# Patient Record
Sex: Female | Born: 1990 | Race: Black or African American | Hispanic: No | Marital: Single | Smoking: Never smoker
Health system: Southern US, Community
[De-identification: ages and names within clinical notes are randomized; demographics above are authoritative.]

## PROBLEM LIST (undated history)

## (undated) DIAGNOSIS — N201 Calculus of ureter: Secondary | ICD-10-CM

## (undated) DIAGNOSIS — T7840XA Allergy, unspecified, initial encounter: Secondary | ICD-10-CM

## (undated) HISTORY — DX: Allergy, unspecified, initial encounter: T78.40XA

---
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
## 2010-06-17 ENCOUNTER — Ambulatory Visit: Payer: Self-pay | Admitting: Family Medicine

## 2010-10-21 ENCOUNTER — Telehealth: Payer: Self-pay | Admitting: Family Medicine

## 2010-10-21 NOTE — Telephone Encounter (Signed)
Called dad told him of what jcl had said

## 2010-10-21 NOTE — Telephone Encounter (Signed)
She needs a followup appointment. She has not been seen since May of last year

## 2010-10-25 ENCOUNTER — Encounter: Payer: Self-pay | Admitting: Family Medicine

## 2010-10-27 ENCOUNTER — Encounter: Payer: Self-pay | Admitting: Family Medicine

## 2010-10-27 ENCOUNTER — Ambulatory Visit (INDEPENDENT_AMBULATORY_CARE_PROVIDER_SITE_OTHER): Payer: Managed Care, Other (non HMO) | Admitting: Family Medicine

## 2010-10-27 VITALS — BP 120/80 | HR 90 | Wt 161.0 lb

## 2010-10-27 DIAGNOSIS — F909 Attention-deficit hyperactivity disorder, unspecified type: Secondary | ICD-10-CM | POA: Insufficient documentation

## 2010-10-27 NOTE — Progress Notes (Signed)
  Subjective:    Patient ID: Melanie Mclaughlin, female    DOB: 05/25/1990, 20 y.o.   MRN: [id number]  HPI She is here for a followup on her underlying ADD. She  Was diagnosed approximately 6 years ago. Presently she is on Adderall XR 20 mg. She states it lasts approximately 8 hours. She notes that it mainly helps her stay focused. She will occasionally have difficulty with headache with this. She is not sure which type of Adderall works best for her   Review of Systems     Objective:   Physical Exam Alert and in no distress otherwise not examined       Assessment & Plan:  ADD She will call and let me know which medicine works the best for her. I also talked to her about school in general and studying while the medicine is still taking affect. She has been doing this.

## 2010-10-27 NOTE — Patient Instructions (Signed)
Call back with the exact name of the medicine and I will write prescriptions for you

## 2010-11-15 ENCOUNTER — Telehealth: Payer: Self-pay

## 2010-11-15 NOTE — Telephone Encounter (Signed)
She needs an appointment.

## 2010-11-15 NOTE — Telephone Encounter (Signed)
Dad called said she needed refill of adderall will pick up when ready and she likes the pill not the capsule

## 2010-11-16 ENCOUNTER — Telehealth: Payer: Self-pay

## 2010-11-16 NOTE — Telephone Encounter (Signed)
Called pt to let her know Dr.lalonde wants her to have an appt. Left message

## 2010-11-16 NOTE — Telephone Encounter (Signed)
Left pt a message make appt

## 2010-11-17 ENCOUNTER — Telehealth: Payer: Self-pay | Admitting: Family Medicine

## 2010-11-17 NOTE — Telephone Encounter (Signed)
It appears that at visit there was a question of which medication seemed more effective--pt was to contact Dr. Susann Givens and let him know which formulation of Adderall she prefers (the XR or the regular).  Let us know, and we will Rx

## 2010-11-18 ENCOUNTER — Telehealth: Payer: Self-pay | Admitting: *Deleted

## 2010-11-18 DIAGNOSIS — F988 Other specified behavioral and emotional disorders with onset usually occurring in childhood and adolescence: Secondary | ICD-10-CM

## 2010-11-18 MED ORDER — AMPHETAMINE-DEXTROAMPHETAMINE 20 MG PO TABS: 20.0000 mg | ORAL_TABLET | Freq: Two times a day (BID) | ORAL | Status: DC

## 2010-11-18 NOTE — Telephone Encounter (Signed)
Left  Message for patient to call back and let Dr.Knapp know whether the patient wants the regular Adderall or the XR, also want to make sure she on on 20mg  BID.

## 2010-11-18 NOTE — Telephone Encounter (Signed)
Patient's father called back and let me know that Melanie Mclaughlin prefers the regular Adderall, not the XR and she takes 20mg  BID. Printed rx, Dr.Knapp will sign and patient may pick up today.

## 2011-01-14 LAB — DIFFERENTIAL
Basophils Absolute: 0.1
Basophils Relative: 1
Eosinophils Absolute: 0
Eosinophils Relative: 0
Lymphocytes Relative: 4 — ABNORMAL LOW
Lymphs Abs: 0.3 — ABNORMAL LOW
Monocytes Absolute: 0.3
Monocytes Relative: 4
Neutro Abs: 7.7
Neutrophils Relative %: 91 — ABNORMAL HIGH

## 2011-01-14 LAB — COMPREHENSIVE METABOLIC PANEL
ALT: 11
AST: 33
Albumin: 4.9
Calcium: 9.8
Creatinine, Ser: 0.7
Sodium: 139
Total Protein: 8.3

## 2011-01-14 LAB — CBC
HCT: 39.3
Hemoglobin: 13.7
MCHC: 34.8
MCV: 91.2
Platelets: 218
RBC: 4.3
RDW: 11.8
WBC: 8.4

## 2011-01-14 LAB — URINALYSIS, ROUTINE W REFLEX MICROSCOPIC
Bilirubin Urine: NEGATIVE
Glucose, UA: NEGATIVE
Hgb urine dipstick: NEGATIVE
Ketones, ur: 15 — AB
Nitrite: NEGATIVE
Protein, ur: NEGATIVE
Specific Gravity, Urine: 1.028
Urobilinogen, UA: 0.2
pH: 7.5

## 2011-01-14 LAB — COMPREHENSIVE METABOLIC PANEL WITH GFR
Alkaline Phosphatase: 55
BUN: 13
CO2: 25
Chloride: 102
Glucose, Bld: 98
Potassium: 4
Total Bilirubin: 0.6

## 2011-01-14 LAB — LIPASE, BLOOD: Lipase: 30

## 2011-01-14 LAB — PREGNANCY, URINE: Preg Test, Ur: NEGATIVE

## 2013-08-28 ENCOUNTER — Encounter (HOSPITAL_BASED_OUTPATIENT_CLINIC_OR_DEPARTMENT_OTHER): Payer: Self-pay | Admitting: Emergency Medicine

## 2013-08-28 ENCOUNTER — Emergency Department (HOSPITAL_BASED_OUTPATIENT_CLINIC_OR_DEPARTMENT_OTHER)
Admission: EM | Admit: 2013-08-28 | Discharge: 2013-08-28 | Disposition: A | Discharge status: Home / Self Care | Payer: Self-pay | Attending: Emergency Medicine | Admitting: Emergency Medicine

## 2013-08-28 ENCOUNTER — Emergency Department (HOSPITAL_BASED_OUTPATIENT_CLINIC_OR_DEPARTMENT_OTHER): Payer: Managed Care, Other (non HMO)

## 2013-08-28 ENCOUNTER — Ambulatory Visit: Payer: Self-pay | Admitting: Family Medicine

## 2013-08-28 DIAGNOSIS — Z79899 Other long term (current) drug therapy: Secondary | ICD-10-CM | POA: Insufficient documentation

## 2013-08-28 DIAGNOSIS — Z3202 Encounter for pregnancy test, result negative: Secondary | ICD-10-CM | POA: Insufficient documentation

## 2013-08-28 DIAGNOSIS — N200 Calculus of kidney: Secondary | ICD-10-CM | POA: Insufficient documentation

## 2013-08-28 DIAGNOSIS — R638 Other symptoms and signs concerning food and fluid intake: Secondary | ICD-10-CM | POA: Insufficient documentation

## 2013-08-28 LAB — COMPREHENSIVE METABOLIC PANEL
ALT: 13 U/L (ref 0–35)
AST: 24 U/L (ref 0–37)
Albumin: 4.3 g/dL (ref 3.5–5.2)
Alkaline Phosphatase: 49 U/L (ref 39–117)
BUN: 8 mg/dL (ref 6–23)
CALCIUM: 9.7 mg/dL (ref 8.4–10.5)
CO2: 22 meq/L (ref 19–32)
CREATININE: 1 mg/dL (ref 0.50–1.10)
Chloride: 103 mEq/L (ref 96–112)
GFR, EST NON AFRICAN AMERICAN: 79 mL/min — AB (ref >90)
GLUCOSE: 117 mg/dL — AB (ref 70–99)
Potassium: 3.7 mEq/L (ref 3.7–5.3)
Sodium: 141 mEq/L (ref 137–147)
Total Bilirubin: 0.2 mg/dL — ABNORMAL LOW (ref 0.3–1.2)
Total Protein: 7.8 g/dL (ref 6.0–8.3)

## 2013-08-28 LAB — CBC
HEMATOCRIT: 37.2 % (ref 36.0–46.0)
HEMOGLOBIN: 13.1 g/dL (ref 12.0–15.0)
MCH: 31.3 pg (ref 26.0–34.0)
MCHC: 35.2 g/dL (ref 30.0–36.0)
MCV: 89 fL (ref 78.0–100.0)
Platelets: 327 10*3/uL (ref 150–400)
RBC: 4.18 MIL/uL (ref 3.87–5.11)
RDW: 11.6 % (ref 11.5–15.5)
WBC: 10.8 10*3/uL — ABNORMAL HIGH (ref 4.0–10.5)

## 2013-08-28 LAB — URINALYSIS, ROUTINE W REFLEX MICROSCOPIC
BILIRUBIN URINE: NEGATIVE
Glucose, UA: NEGATIVE mg/dL
Ketones, ur: 15 mg/dL — AB
Leukocytes, UA: NEGATIVE
Nitrite: NEGATIVE
PH: 7 (ref 5.0–8.0)
Protein, ur: NEGATIVE mg/dL
SPECIFIC GRAVITY, URINE: 1.015 (ref 1.005–1.030)
UROBILINOGEN UA: 0.2 mg/dL (ref 0.0–1.0)

## 2013-08-28 LAB — URINE MICROSCOPIC-ADD ON

## 2013-08-28 LAB — PREGNANCY, URINE: Preg Test, Ur: NEGATIVE

## 2013-08-28 LAB — HIV ANTIBODY (ROUTINE TESTING W REFLEX): HIV 1&2 Ab, 4th Generation: NONREACTIVE

## 2013-08-28 MED ORDER — SODIUM CHLORIDE 0.9 % IV BOLUS (SEPSIS)
1000.0000 mL | Freq: Once | INTRAVENOUS | Status: AC
Start: 2013-08-28 — End: 2013-08-28
  Administered 2013-08-28: 1000 mL via INTRAVENOUS

## 2013-08-28 MED ORDER — ONDANSETRON HCL 4 MG PO TABS: 4.0000 mg | ORAL_TABLET | Freq: Four times a day (QID) | ORAL | Status: DC

## 2013-08-28 MED ORDER — ONDANSETRON HCL 4 MG/2ML IJ SOLN
4.0000 mg | Freq: Once | INTRAMUSCULAR | Status: AC
  Administered 2013-08-28: 4 mg via INTRAVENOUS

## 2013-08-28 MED ORDER — HYDROMORPHONE HCL PF 1 MG/ML IJ SOLN
0.5000 mg | Freq: Once | INTRAMUSCULAR | Status: AC
  Administered 2013-08-28: 0.5 mg via INTRAVENOUS
  Filled 2013-08-28: qty 1

## 2013-08-28 MED ORDER — OXYCODONE-ACETAMINOPHEN 5-325 MG PO TABS: 1.0000 | ORAL_TABLET | ORAL | Status: DC | PRN

## 2013-08-28 MED ORDER — SODIUM CHLORIDE 0.9 % IV BOLUS (SEPSIS)
1000.0000 mL | Freq: Once | INTRAVENOUS | Status: AC
  Administered 2013-08-28: 1000 mL via INTRAVENOUS

## 2013-08-28 MED ORDER — ONDANSETRON HCL 4 MG/2ML IJ SOLN
INTRAMUSCULAR | Status: AC
  Administered 2013-08-28: 4 mg via INTRAVENOUS
  Filled 2013-08-28: qty 2

## 2013-08-28 NOTE — ED Notes (Signed)
Abdominal pain and vomiting that started today.

## 2013-08-28 NOTE — ED Notes (Signed)
In and out cath performed, pt tolerated.  Cloudy urine return noted.

## 2013-08-28 NOTE — ED Notes (Signed)
PA at bedside.

## 2013-08-28 NOTE — ED Notes (Signed)
Family at bedside. 

## 2013-08-28 NOTE — ED Notes (Signed)
Pt ambulated unassisted to bathroom to attempt to provide urine sample.

## 2013-08-28 NOTE — ED Notes (Signed)
Pt returned to room. Still unable to urinate.

## 2013-08-28 NOTE — ED Provider Notes (Signed)
CSN: [id number]     Arrival date & time 08/28/13  1240 History   First MD Initiated Contact with Patient 08/28/13 1303     Chief Complaint  Patient presents with  . Abdominal Pain     (Consider location/radiation/quality/duration/timing/severity/associated sxs/prior Treatment) HPI Comments: 23 year old presents with sudden onset, 5 hours, of sharp LLQ pain with no radiation.  She rates her pain an 8/10 that doubled her over.  Standing up makes the pain worse, and she only had a few moments of relief after she vomited.  She has had nausea and vomiting with no blood.  Denies having a fever or chills.  Has not had an appetite today.  Denies chest pain, SOB, constipation, diarrhea, melena, dysuria, hematuria, or vaginal discharge.  Her LMP was last week.   Patient is a 23 y.o. female presenting with abdominal pain.  Abdominal Pain Associated symptoms: nausea and vomiting   Associated symptoms: no chest pain, no constipation, no cough, no diarrhea, no fever, no hematuria, no shortness of breath, no vaginal bleeding and no vaginal discharge       Past Medical History  Diagnosis Date  . Allergy     RHINITIS   History reviewed. No pertinent past surgical history. Family History  Problem Relation Age of Onset  . Asthma Sister   . Cancer Maternal Aunt   . Hypertension Maternal Grandmother    History  Substance Use Topics  . Smoking status: Never Smoker   . Smokeless tobacco: Never Used  . Alcohol Use: No   OB History   Grav Para Term Preterm Abortions TAB SAB Ect Mult Living                 Review of Systems  Constitutional: Positive for appetite change. Negative for fever.  Respiratory: Negative for cough, chest tightness, shortness of breath and wheezing.   Cardiovascular: Negative for chest pain.  Gastrointestinal: Positive for nausea, vomiting, abdominal pain and abdominal distention. Negative for diarrhea, constipation and blood in stool.  Genitourinary: Negative for  hematuria, flank pain, vaginal bleeding, vaginal discharge and pelvic pain.  Neurological: Negative for numbness and headaches.      Allergies  Review of patient's allergies indicates no known allergies.  Home Medications   Prior to Admission medications   Medication Sig Start Date End Date Taking? Authorizing Provider  amphetamine-dextroamphetamine (ADDERALL) 20 MG tablet Take 1 tablet (20 mg total) by mouth 2 (two) times daily. 11/18/10   Joselyn ArrowEve Knapp, MD   Pulse 119  Temp(Src) 98.4 F (36.9 C) (Oral)  Resp 20  Ht 5\' 7"  (1.702 m)  Wt 160 lb (72.576 kg)  BMI 25.05 kg/m2  SpO2 100%  LMP 08/21/2013 Physical Exam  Vitals reviewed. Constitutional: She is oriented to person, place, and time. She appears well-developed and well-nourished.  Uncomfortable appearing.  HENT:  Head: Normocephalic.  Eyes: Pupils are equal, round, and reactive to light.  Neck: Normal range of motion.  Cardiovascular: Normal rate, regular rhythm and normal heart sounds.   Pulmonary/Chest: Effort normal and breath sounds normal. She has no wheezes.  Abdominal: Soft. She exhibits no mass. There is tenderness. There is no rebound.  Patients abdomen felt soft but distended.  Unable to lie still. Bowel sounds were hypoactive.  Genitourinary:  Deferred due to patient discomfort   Neurological: She is alert and oriented to person, place, and time.  Psychiatric: She has a normal mood and affect.    ED Course  Procedures (including critical care time)  Labs Review Labs Reviewed  WET PREP, GENITAL  GC/CHLAMYDIA PROBE AMP  PREGNANCY, URINE  URINALYSIS, ROUTINE W REFLEX MICROSCOPIC  CBC  COMPREHENSIVE METABOLIC PANEL  HIV ANTIBODY (ROUTINE TESTING)   Results for orders placed during the hospital encounter of 08/28/13  PREGNANCY, URINE      Result Value Ref Range   Preg Test, Ur NEGATIVE  NEGATIVE  URINALYSIS, ROUTINE W REFLEX MICROSCOPIC      Result Value Ref Range   Color, Urine YELLOW  YELLOW    APPearance CLOUDY (*) CLEAR   Specific Gravity, Urine 1.015  1.005 - 1.030   pH 7.0  5.0 - 8.0   Glucose, UA NEGATIVE  NEGATIVE mg/dL   Hgb urine dipstick LARGE (*) NEGATIVE   Bilirubin Urine NEGATIVE  NEGATIVE   Ketones, ur 15 (*) NEGATIVE mg/dL   Protein, ur NEGATIVE  NEGATIVE mg/dL   Urobilinogen, UA 0.2  0.0 - 1.0 mg/dL   Nitrite NEGATIVE  NEGATIVE   Leukocytes, UA NEGATIVE  NEGATIVE  CBC      Result Value Ref Range   WBC 10.8 (*) 4.0 - 10.5 K/uL   RBC 4.18  3.87 - 5.11 MIL/uL   Hemoglobin 13.1  12.0 - 15.0 g/dL   HCT 16.1  09.6 - 04.5 %   MCV 89.0  78.0 - 100.0 fL   MCH 31.3  26.0 - 34.0 pg   MCHC 35.2  30.0 - 36.0 g/dL   RDW 40.9  81.1 - 91.4 %   Platelets 327  150 - 400 K/uL  COMPREHENSIVE METABOLIC PANEL      Result Value Ref Range   Sodium 141  137 - 147 mEq/L   Potassium 3.7  3.7 - 5.3 mEq/L   Chloride 103  96 - 112 mEq/L   CO2 22  19 - 32 mEq/L   Glucose, Bld 117 (*) 70 - 99 mg/dL   BUN 8  6 - 23 mg/dL   Creatinine, Ser 7.82  0.50 - 1.10 mg/dL   Calcium 9.7  8.4 - 95.6 mg/dL   Total Protein 7.8  6.0 - 8.3 g/dL   Albumin 4.3  3.5 - 5.2 g/dL   AST 24  0 - 37 U/L   ALT 13  0 - 35 U/L   Alkaline Phosphatase 49  39 - 117 U/L   Total Bilirubin 0.2 (*) 0.3 - 1.2 mg/dL   GFR calc non Af Amer 79 (*) >90 mL/min   GFR calc Af Amer >90  >90 mL/min  URINE MICROSCOPIC-ADD ON      Result Value Ref Range   Squamous Epithelial / LPF FEW (*) RARE   RBC / HPF TOO NUMEROUS TO COUNT  <3 RBC/hpf   Bacteria, UA FEW (*) RARE   Ct Abdomen Pelvis Wo Contrast  08/28/2013   CLINICAL DATA:  Abdominal pain  EXAM: CT ABDOMEN AND PELVIS WITHOUT CONTRAST  TECHNIQUE: Multidetector CT imaging of the abdomen and pelvis was performed following the standard protocol without IV contrast.  COMPARISON:  None.  FINDINGS: There is no pleural or pericardial effusion. The lung bases appear clear.  7 mm low attenuation structure in the periphery of the right hepatic lobe is too small to characterize. The  gallbladder appears normal. No biliary dilatation. Normal appearance of the pancreas. The spleen is unremarkable.  The adrenal glands are both normal. Small bilateral renal calculi are identified this measures up to 3 mm, image 24/series 2 peer there is left-sided nephromegaly and perinephric fat stranding. Left-sided  hydroureter is identified stone in the distal left ureter measures measures 3 mm, image 62/series 5. The urinary bladder appears normal. The uterus and adnexal structures are unremarkable.  Normal caliber of the abdominal aorta. No aneurysm. No upper abdominal adenopathy identified. There is no pelvic or inguinal adenopathy noted.  The stomach and the small bowel loops have a normal course and caliber. No obstruction. The appendix is visualized and appears normal. Normal appearance of the colon.  Review of the visualized bony structures is unremarkable.  IMPRESSION: 1. Bilateral renal calculi. 2. Mild left-sided hydronephrosis and nephromegaly secondary to 3 mm distal ureteral calculus.   Electronically Signed   By: Signa Kellaylor  Stroud M.D.   On: 08/28/2013 17:17   Imaging Review No results found.   EKG Interpretation None      MDM   Final diagnoses:  None    1. Kidney stones  Pain is controlled in the ED with IV Dilaudid. No history of stones. Will provide urology referral as she has bilateral renal stones in addition to the 3 mm ureteral stone on left likely the source of her pain. Stable for discharge.     Arnoldo HookerShari A Alyana Kreiter, PA-C 08/28/13 1746

## 2013-08-28 NOTE — Discharge Instructions (Signed)
Diet for Kidney Stones °Kidney stones are small, hard masses that form inside your kidneys. They are made up of salts and minerals and often form when high levels build up in the urine. The minerals can then start to build up, crystalize, and stick together to form stones. There are several different types of kidney stones. The following types of stones may be influenced by dietary factors:  °· Calcium Oxalate Stones. An oxalate is a salt found in certain foods. Within the body, calcium can combine with oxalates to form calcium oxalate stones, which can be excreted in the urine in high amounts. This is the most common type of kidney stone. °· Calcium Phosphate Stones. These stones may occur when the pH of the urine becomes too high, or less acidic, from too much calcium being excreted in the urine. The pH is a measure of how acidic or basic a substance is. °· Uric Acid Stones. This type of stone occurs when the pH of the urine becomes too low, or very acidic, because substances called purines build up in the urine. Purines are found in animal proteins. When the urine is highly concentrated with acid, uric acid kidney stones can form.   °Other risk factors for kidney stones include genetics, environment, and being overweight. Your caregiver may ask you to follow specific diet guidelines based on the type of stone you have to lessen the chances of your body making more kidney stones.  °GENERAL GUIDELINES FOR ALL TYPES OF STONES °· Drink plenty of fluid. Drink 12 16 cups of fluid a day, drinking mainly water. This is the most important thing you can do to prevent the formation of future kidney stones. °· Maintain a healthy weight. Your caregiver or dietitian can help you determine what a healthy weight is for you. If you are overweight, weight loss may help prevent the formation of future kidney stones. °· Eat a diet adequate in animal protein. Too much animal protein can contribute to the formation of stones. Your  dietitian can help you determine how much protein you should be eating. Avoid low carbohydrate, high protein diets. °· Follow a balanced eating approach. The DASH diet, which stands for "Dietary Approaches to Stop Hypertension," is an effective meal plan for reducing stone formation. This diet is high in fruits, vegetables, dairy, and whole grains and low in animal protein. Ask your caregiver or dietitian for information about the DASH diet. °ADDITIONAL DIET GUIDELINES FOR CALCIUM STONES °Avoid foods high in salt. This includes table salt, salt seasonings, MSG, soy sauce, cured and processed meats, salted crackers and snack foods, fast food, and canned soups and foods. Ask your caregiver or dietitian for information about reducing sodium in your diet or following the low sodium diet.  °Ensure adequate calcium intake. Use the following table for calcium guidelines: °· Men 65 years old and younger  1000 mg/day. °· Men 65 years old and older  1500 mg/day. °· Women 25 23 years old  1000 mg/day. °· Women 50 years and older  1500 mg/day. °Your dietitian can help you determine if you are getting enough calcium in your diet. Foods that are high in calcium include dairy products, broccoli, cheese, yogurt, and pudding. If you need to take a calcium supplement, take it only in the form of calcium citrate.  °Avoid foods high in oxalate. Be sure that any supplements you take do not contain more than 500 mg of vitamin C. Vitamin C is converted into oxalate in the body. You do   not need to avoid fruits and vegetables high in vitamin C.  °· Grains: High-fiber or bran cereal, whole-wheat bread, grits, barley, buckwheat, amaranth, pretzels, and fruitcake. °· Vegetables: Dried beans, wax beans, dark leafy greens, eggplant, leeks, okra, parsley, rutabaga, tomato paste, watercress, zucchini, and escarole. °· Fruit: Dried apricots, red currants, figs, kiwi, and rhubarb. °· Meat and Meat Substitutes: Soybeans and foods made from soy  (soyburger, miso), dried beans, peanut butter. °· Milk: Chocolate milk mixes and soymilk. °· Fats and Oils: Nuts (peanuts, almonds, pecans, cashews, hazelnuts) and nut butters, sesame seeds, and tDahini paste. °· Condiments/Miscellaneous: Chocolate, carob, marmalade, poppy seeds, instant iced tea, and juice from high-oxalate fruits.    °Document Released: 07/30/2010 Document Revised: 10/04/2011 Document Reviewed: 09/19/2011 °Exit[employer]® Patient Information ©2014 [employer]. °Kidney Stones °Kidney stones (urolithiasis) are deposits that form inside your kidneys. The intense pain is caused by the stone moving through the urinary tract. When the stone moves, the ureter goes into spasm around the stone. The stone is usually passed in the urine.  °CAUSES  °· A disorder that makes certain neck glands produce too much parathyroid hormone (primary hyperparathyroidism). °· A buildup of uric acid crystals, similar to gout in your joints. °· Narrowing (stricture) of the ureter. °· A kidney obstruction present at birth (congenital obstruction). °· Previous surgery on the kidney or ureters. °· Numerous kidney infections. °SYMPTOMS  °· Feeling sick to your stomach (nauseous). °· Throwing up (vomiting). °· Blood in the urine (hematuria). °· Pain that usually spreads (radiates) to the groin. °· Frequency or urgency of urination. °DIAGNOSIS  °· Taking a history and physical exam. °· Blood or urine tests. °· CT scan. °· Occasionally, an examination of the inside of the urinary bladder (cystoscopy) is performed. °TREATMENT  °· Observation. °· Increasing your fluid intake. °· Extracorporeal shock wave lithotripsy This is a noninvasive procedure that uses shock waves to break up kidney stones. °· Surgery may be needed if you have severe pain or persistent obstruction. There are various surgical procedures. Most of the procedures are performed with the use of small instruments. Only small incisions are needed to accommodate these  instruments, so recovery time is minimized. °The size, location, and chemical composition are all important variables that will determine the proper choice of action for you. Talk to your health care provider to better understand your situation so that you will minimize the risk of injury to yourself and your kidney.  °HOME CARE INSTRUCTIONS  °· Drink enough water and fluids to keep your urine clear or pale yellow. This will help you to pass the stone or stone fragments. °· Strain all urine through the provided strainer. Keep all particulate matter and stones for your health care provider to see. The stone causing the pain may be as small as a grain of salt. It is very important to use the strainer each and every time you pass your urine. The collection of your stone will allow your health care provider to analyze it and verify that a stone has actually passed. The stone analysis will often identify what you can do to reduce the incidence of recurrences. °· Only take over-the-counter or prescription medicines for pain, discomfort, or fever as directed by your health care provider. °· Make a follow-up appointment with your health care provider as directed. °· Get follow-up X-rays if required. The absence of pain does not always mean that the stone has passed. It may have only stopped moving. If the urine remains completely obstructed, it can   cause loss of kidney function or even complete destruction of the kidney. It is your responsibility to make sure X-rays and follow-ups are completed. Ultrasounds of the kidney can show blockages and the status of the kidney. Ultrasounds are not associated with any radiation and can be performed easily in a matter of minutes. °SEEK MEDICAL CARE IF: °· You experience pain that is progressive and unresponsive to any pain medicine you have been prescribed. °SEEK IMMEDIATE MEDICAL CARE IF:  °· Pain cannot be controlled with the prescribed medicine. °· You have a fever or shaking  chills. °· The severity or intensity of pain increases over 18 hours and is not relieved by pain medicine. °· You develop a new onset of abdominal pain. °· You feel faint or pass out. °· You are unable to urinate. °MAKE SURE YOU:  °· Understand these instructions. °· Will watch your condition. °· Will get help right away if you are not doing well or get worse. °Document Released: 04/04/2005 Document Revised: 12/05/2012 Document Reviewed: 09/05/2012 °ExitCare® Patient Information ©2014 [employer]. ° ° °

## 2013-08-28 NOTE — ED Notes (Addendum)
Bladder scanner shows just over of urine. Pt agrees to in/out cath.

## 2013-08-28 NOTE — ED Notes (Signed)
Pt ambulatory to BR and unable to void.

## 2013-08-28 NOTE — ED Provider Notes (Signed)
Medical screening examination/treatment/procedure(s) were performed by non-physician practitioner and as supervising physician I was immediately available for consultation/collaboration.   EKG Interpretation None        Malikhi Ogan B. Bernette MayersSheldon, MD 08/28/13 2251

## 2013-08-28 NOTE — ED Notes (Signed)
Order from PA to in/out cath for urine sample. Pt wants to try voiding again. Ambulated back to bathroom.

## 2013-08-29 ENCOUNTER — Encounter (HOSPITAL_COMMUNITY): Payer: Self-pay | Admitting: Emergency Medicine

## 2013-08-29 ENCOUNTER — Emergency Department (HOSPITAL_COMMUNITY)
Admission: EM | Admit: 2013-08-29 | Discharge: 2013-08-29 | Disposition: A | Discharge status: Home / Self Care | Payer: Managed Care, Other (non HMO) | Attending: Emergency Medicine | Admitting: Emergency Medicine

## 2013-08-29 DIAGNOSIS — N23 Unspecified renal colic: Secondary | ICD-10-CM

## 2013-08-29 DIAGNOSIS — Z87442 Personal history of urinary calculi: Secondary | ICD-10-CM | POA: Insufficient documentation

## 2013-08-29 DIAGNOSIS — Z79899 Other long term (current) drug therapy: Secondary | ICD-10-CM | POA: Insufficient documentation

## 2013-08-29 MED ORDER — OXYCODONE-ACETAMINOPHEN 7.5-325 MG PO TABS: 1.0000 | ORAL_TABLET | ORAL | Status: DC | PRN

## 2013-08-29 MED ORDER — KETOROLAC TROMETHAMINE 60 MG/2ML IM SOLN
60.0000 mg | Freq: Once | INTRAMUSCULAR | Status: AC
  Administered 2013-08-29: 60 mg via INTRAMUSCULAR
  Filled 2013-08-29: qty 2

## 2013-08-29 MED ORDER — HYDROMORPHONE HCL PF 1 MG/ML IJ SOLN
2.0000 mg | Freq: Once | INTRAMUSCULAR | Status: AC
  Administered 2013-08-29: 2 mg via INTRAVENOUS
  Filled 2013-08-29: qty 2

## 2013-08-29 MED ORDER — ONDANSETRON 8 MG PO TBDP: 8.0000 mg | ORAL_TABLET | Freq: Three times a day (TID) | ORAL | Status: DC | PRN

## 2013-08-29 MED ORDER — ONDANSETRON 8 MG PO TBDP
8.0000 mg | ORAL_TABLET | Freq: Once | ORAL | Status: AC
  Administered 2013-08-29: 8 mg via ORAL
  Filled 2013-08-29: qty 1

## 2013-08-29 NOTE — ED Notes (Signed)
Patient c/o flank pain, unable to sit still, moaning, was seen at [facility] yesterday, Dx with renal calculi, recommended follow up with urology. Patient states in too much pain to wait for urology.

## 2013-08-29 NOTE — Discharge Instructions (Signed)
Kidney Stones  Kidney stones (urolithiasis) are deposits that form inside your kidneys. The intense pain is caused by the stone moving through the urinary tract. When the stone moves, the ureter goes into spasm around the stone. The stone is usually passed in the urine.   CAUSES   · A disorder that makes certain neck glands produce too much parathyroid hormone (primary hyperparathyroidism).  · A buildup of uric acid crystals, similar to gout in your joints.  · Narrowing (stricture) of the ureter.  · A kidney obstruction present at birth (congenital obstruction).  · Previous surgery on the kidney or ureters.  · Numerous kidney infections.  SYMPTOMS   · Feeling sick to your stomach (nauseous).  · Throwing up (vomiting).  · Blood in the urine (hematuria).  · Pain that usually spreads (radiates) to the groin.  · Frequency or urgency of urination.  DIAGNOSIS   · Taking a history and physical exam.  · Blood or urine tests.  · CT scan.  · Occasionally, an examination of the inside of the urinary bladder (cystoscopy) is performed.  TREATMENT   · Observation.  · Increasing your fluid intake.  · Extracorporeal shock wave lithotripsy This is a noninvasive procedure that uses shock waves to break up kidney stones.  · Surgery may be needed if you have severe pain or persistent obstruction. There are various surgical procedures. Most of the procedures are performed with the use of small instruments. Only small incisions are needed to accommodate these instruments, so recovery time is minimized.  The size, location, and chemical composition are all important variables that will determine the proper choice of action for you. Talk to your health care provider to better understand your situation so that you will minimize the risk of injury to yourself and your kidney.   HOME CARE INSTRUCTIONS   · Drink enough water and fluids to keep your urine clear or pale yellow. This will help you to pass the stone or stone fragments.  · Strain  all urine through the provided strainer. Keep all particulate matter and stones for your health care provider to see. The stone causing the pain may be as small as a grain of salt. It is very important to use the strainer each and every time you pass your urine. The collection of your stone will allow your health care provider to analyze it and verify that a stone has actually passed. The stone analysis will often identify what you can do to reduce the incidence of recurrences.  · Only take over-the-counter or prescription medicines for pain, discomfort, or fever as directed by your health care provider.  · Make a follow-up appointment with your health care provider as directed.  · Get follow-up X-rays if required. The absence of pain does not always mean that the stone has passed. It may have only stopped moving. If the urine remains completely obstructed, it can cause loss of kidney function or even complete destruction of the kidney. It is your responsibility to make sure X-rays and follow-ups are completed. Ultrasounds of the kidney can show blockages and the status of the kidney. Ultrasounds are not associated with any radiation and can be performed easily in a matter of minutes.  SEEK MEDICAL CARE IF:  · You experience pain that is progressive and unresponsive to any pain medicine you have been prescribed.  SEEK IMMEDIATE MEDICAL CARE IF:   · Pain cannot be controlled with the prescribed medicine.  · You have a fever   or shaking chills.  · The severity or intensity of pain increases over 18 hours and is not relieved by pain medicine.  · You develop a new onset of abdominal pain.  · You feel faint or pass out.  · You are unable to urinate.  MAKE SURE YOU:   · Understand these instructions.  · Will watch your condition.  · Will get help right away if you are not doing well or get worse.  Document Released: 04/04/2005 Document Revised: 12/05/2012 Document Reviewed: 09/05/2012  ExitCare® Patient Information ©2014  [employer].

## 2013-08-29 NOTE — ED Provider Notes (Signed)
CSN: [id number]     Arrival date & time 08/29/13  1603 History   First MD Initiated Contact with Patient 08/29/13 1640     Chief Complaint  Patient presents with  . Flank Pain     (Consider location/radiation/quality/duration/timing/severity/associated sxs/prior Treatment) Patient is a 23 y.o. female presenting with flank pain. The history is provided by the patient and a parent.  Flank Pain This is a new problem. The problem occurs constantly. The problem has been gradually worsening. Pertinent negatives include no chest pain, no abdominal pain, no headaches and no shortness of breath. Nothing aggravates the symptoms. Nothing relieves the symptoms.  pt seen yesterday and dx with 3mm left kidney stone--using her home med without relief--no fever or chills, pain is similar  Past Medical History  Diagnosis Date  . Allergy     RHINITIS   No past surgical history on file. Family History  Problem Relation Age of Onset  . Asthma Sister   . Cancer Maternal Aunt   . Hypertension Maternal Grandmother    History  Substance Use Topics  . Smoking status: Never Smoker   . Smokeless tobacco: Never Used  . Alcohol Use: No   OB History   Grav Para Term Preterm Abortions TAB SAB Ect Mult Living                 Review of Systems  Respiratory: Negative for shortness of breath.   Cardiovascular: Negative for chest pain.  Gastrointestinal: Negative for abdominal pain.  Genitourinary: Positive for flank pain.  Neurological: Negative for headaches.  All other systems reviewed and are negative.     Allergies  Review of patient's allergies indicates no known allergies.  Home Medications   Prior to Admission medications   Medication Sig Start Date End Date Taking? Authorizing Provider  amphetamine-dextroamphetamine (ADDERALL) 20 MG tablet Take 1 tablet (20 mg total) by mouth 2 (two) times daily. 11/18/10   Joselyn ArrowEve Knapp, MD  ondansetron (ZOFRAN) 4 MG tablet Take 1 tablet (4 mg total) by  mouth every 6 (six) hours. 08/28/13   Shari A Upstill, PA-C  oxyCODONE-acetaminophen (PERCOCET/ROXICET) 5-325 MG per tablet Take 1-2 tablets by mouth every 4 (four) hours as needed for severe pain. 08/28/13   Shari A Upstill, PA-C   BP 158/98  Pulse 96  Temp(Src) 98.6 F (37 C) (Oral)  Resp 20  SpO2 100%  LMP 08/21/2013 Physical Exam  Nursing note and vitals reviewed. Constitutional: She is oriented to person, place, and time. She appears well-developed and well-nourished.  Non-toxic appearance. No distress.  HENT:  Head: Normocephalic and atraumatic.  Eyes: Conjunctivae, EOM and lids are normal. Pupils are equal, round, and reactive to light.  Neck: Normal range of motion. Neck supple. No tracheal deviation present. No mass present.  Cardiovascular: Normal rate, regular rhythm and normal heart sounds.  Exam reveals no gallop.   No murmur heard. Pulmonary/Chest: Effort normal and breath sounds normal. No stridor. No respiratory distress. She has no decreased breath sounds. She has no wheezes. She has no rhonchi. She has no rales.  Abdominal: Soft. Normal appearance and bowel sounds are normal. She exhibits no distension. There is no tenderness. There is CVA tenderness. There is no rebound.  Musculoskeletal: Normal range of motion. She exhibits no edema and no tenderness.  Neurological: She is alert and oriented to person, place, and time. She has normal strength. No cranial nerve deficit or sensory deficit. GCS eye subscore is 4. GCS verbal subscore is 5. GCS  motor subscore is 6.  Skin: Skin is warm and dry. No abrasion and no rash noted.  Psychiatric: She has a normal mood and affect. Her speech is normal and behavior is normal.    ED Course  Procedures (including critical care time) Labs Review Labs Reviewed - No data to display  Imaging Review Ct Abdomen Pelvis Wo Contrast  08/28/2013   CLINICAL DATA:  Abdominal pain  EXAM: CT ABDOMEN AND PELVIS WITHOUT CONTRAST  TECHNIQUE:  Multidetector CT imaging of the abdomen and pelvis was performed following the standard protocol without IV contrast.  COMPARISON:  None.  FINDINGS: There is no pleural or pericardial effusion. The lung bases appear clear.  7 mm low attenuation structure in the periphery of the right hepatic lobe is too small to characterize. The gallbladder appears normal. No biliary dilatation. Normal appearance of the pancreas. The spleen is unremarkable.  The adrenal glands are both normal. Small bilateral renal calculi are identified this measures up to 3 mm, image 24/series 2 peer there is left-sided nephromegaly and perinephric fat stranding. Left-sided hydroureter is identified stone in the distal left ureter measures measures 3 mm, image 62/series 5. The urinary bladder appears normal. The uterus and adnexal structures are unremarkable.  Normal caliber of the abdominal aorta. No aneurysm. No upper abdominal adenopathy identified. There is no pelvic or inguinal adenopathy noted.  The stomach and the small bowel loops have a normal course and caliber. No obstruction. The appendix is visualized and appears normal. Normal appearance of the colon.  Review of the visualized bony structures is unremarkable.  IMPRESSION: 1. Bilateral renal calculi. 2. Mild left-sided hydronephrosis and nephromegaly secondary to 3 mm distal ureteral calculus.   Electronically Signed   By: Signa Kellaylor  Stroud M.D.   On: 08/28/2013 17:17     EKG Interpretation None      MDM   Final diagnoses:  None   Patient given dose of meds here for renal colic and she feels better. I will adjust her opiate medications and she will followup with her urologist     Toy BakerAnthony T Yazlin Ekblad, MD 08/29/13 2008

## 2014-05-05 ENCOUNTER — Encounter (HOSPITAL_BASED_OUTPATIENT_CLINIC_OR_DEPARTMENT_OTHER): Payer: Self-pay | Admitting: *Deleted

## 2014-05-05 ENCOUNTER — Emergency Department (HOSPITAL_BASED_OUTPATIENT_CLINIC_OR_DEPARTMENT_OTHER)
Admission: EM | Admit: 2014-05-05 | Discharge: 2014-05-05 | Disposition: A | Discharge status: Home / Self Care | Payer: 59 | Attending: Emergency Medicine | Admitting: Emergency Medicine

## 2014-05-05 DIAGNOSIS — N23 Unspecified renal colic: Secondary | ICD-10-CM | POA: Diagnosis not present

## 2014-05-05 DIAGNOSIS — Z3202 Encounter for pregnancy test, result negative: Secondary | ICD-10-CM | POA: Diagnosis not present

## 2014-05-05 DIAGNOSIS — Z87442 Personal history of urinary calculi: Secondary | ICD-10-CM | POA: Insufficient documentation

## 2014-05-05 DIAGNOSIS — R109 Unspecified abdominal pain: Secondary | ICD-10-CM | POA: Diagnosis present

## 2014-05-05 HISTORY — DX: Calculus of ureter: N20.1

## 2014-05-05 LAB — URINALYSIS, ROUTINE W REFLEX MICROSCOPIC
GLUCOSE, UA: NEGATIVE mg/dL
KETONES UR: 15 mg/dL — AB
Leukocytes, UA: NEGATIVE
NITRITE: NEGATIVE
Protein, ur: NEGATIVE mg/dL
Specific Gravity, Urine: 1.028 (ref 1.005–1.030)
Urobilinogen, UA: 1 mg/dL (ref 0.0–1.0)
pH: 5.5 (ref 5.0–8.0)

## 2014-05-05 LAB — URINE MICROSCOPIC-ADD ON

## 2014-05-05 LAB — PREGNANCY, URINE: PREG TEST UR: NEGATIVE

## 2014-05-05 MED ORDER — ONDANSETRON 8 MG PO TBDP: 8.0000 mg | ORAL_TABLET | Freq: Three times a day (TID) | ORAL | Status: DC | PRN

## 2014-05-05 MED ORDER — HYDROMORPHONE HCL 1 MG/ML IJ SOLN
1.0000 mg | Freq: Once | INTRAMUSCULAR | Status: AC
  Administered 2014-05-05: 1 mg via INTRAVENOUS
  Filled 2014-05-05: qty 1

## 2014-05-05 MED ORDER — HYDROMORPHONE HCL 4 MG PO TABS: 4.0000 mg | ORAL_TABLET | ORAL | Status: DC | PRN

## 2014-05-05 MED ORDER — TAMSULOSIN HCL 0.4 MG PO CAPS: ORAL_CAPSULE | ORAL | Status: DC

## 2014-05-05 MED ORDER — PROMETHAZINE HCL 25 MG PO TABS: 25.0000 mg | ORAL_TABLET | Freq: Four times a day (QID) | ORAL | Status: DC | PRN

## 2014-05-05 MED ORDER — ONDANSETRON HCL 4 MG/2ML IJ SOLN
4.0000 mg | Freq: Once | INTRAMUSCULAR | Status: AC
  Administered 2014-05-05: 4 mg via INTRAVENOUS
  Filled 2014-05-05: qty 2

## 2014-05-05 MED ORDER — SODIUM CHLORIDE 0.9 % IV SOLN
INTRAVENOUS | Status: DC
  Administered 2014-05-05: 04:00:00 via INTRAVENOUS

## 2014-05-05 MED ORDER — KETOROLAC TROMETHAMINE 30 MG/ML IJ SOLN
30.0000 mg | Freq: Once | INTRAMUSCULAR | Status: AC
  Administered 2014-05-05: 30 mg via INTRAVENOUS

## 2014-05-05 MED ORDER — KETOROLAC TROMETHAMINE 30 MG/ML IJ SOLN
INTRAMUSCULAR | Status: AC
  Filled 2014-05-05: qty 1

## 2014-05-05 NOTE — ED Notes (Signed)
Urine strainer sent home with patient

## 2014-05-05 NOTE — ED Provider Notes (Signed)
CSN: [id number]     Arrival date & time 05/05/14  0309 History   First MD Initiated Contact with Patient 05/05/14 0327     Chief Complaint  Patient presents with  . Flank Pain     (Consider location/radiation/quality/duration/timing/severity/associated sxs/prior Treatment) HPI  This is a 24 year old female with a history of kidney stones. She is here with right flank and right lower quadrant abdominal pain that woke her from sleep about 90 minutes ago. She rates her pain as an 8 out of 10 and characterizes it as like previous kidney stones. She has had nausea and vomiting associated with it. She denies urinary symptoms. The pain is not changed with movement or palpation.  Past Medical History  Diagnosis Date  . Allergy     RHINITIS  . Ureterolithiasis    History reviewed. No pertinent past surgical history. Family History  Problem Relation Age of Onset  . Asthma Sister   . Cancer Maternal Aunt   . Hypertension Maternal Grandmother    History  Substance Use Topics  . Smoking status: Never Smoker   . Smokeless tobacco: Never Used  . Alcohol Use: No   OB History    No data available     Review of Systems  All other systems reviewed and are negative.   Allergies  Review of patient's allergies indicates no known allergies.  Home Medications   Prior to Admission medications   Medication Sig Start Date End Date Taking? Authorizing Provider  ondansetron (ZOFRAN ODT) 8 MG disintegrating tablet Take 1 tablet (8 mg total) by mouth every 8 (eight) hours as needed for nausea or vomiting. 08/29/13   Toy Baker, MD  oxyCODONE-acetaminophen (PERCOCET) 7.5-325 MG per tablet Take 1 tablet by mouth every 4 (four) hours as needed for pain. 08/29/13   Toy Baker, MD   LMP 04/08/2014   Physical Exam  General: Well-developed, well-nourished female in no acute distress; appearance consistent with age of record HENT: normocephalic; atraumatic Eyes: pupils equal, round and  reactive to light; extraocular muscles intact Neck: supple Heart: regular rate and rhythm Lungs: clear to auscultation bilaterally Abdomen: soft; nondistended; nontender; no masses or hepatosplenomegaly; bowel sounds present GU: No CVA tenderness Extremities: No deformity; full range of motion; pulses normal Neurologic: Awake, alert and oriented; motor function intact in all extremities and symmetric; no facial droop Skin: Warm and dry Psychiatric: Tearful    ED Course  Procedures (including critical care time)   MDM  Nursing notes and vitals signs, including pulse oximetry, reviewed.  Summary of this visit's results, reviewed by myself:  Labs:  Results for orders placed or performed during the hospital encounter of 05/05/14 (from the past 24 hour(s))  Urinalysis, Routine w reflex microscopic     Status: Abnormal   Collection Time: 05/05/14  3:21 AM  Result Value Ref Range   Color, Urine AMBER (A) YELLOW   APPearance CLOUDY (A) CLEAR   Specific Gravity, Urine 1.028 1.005 - 1.030   pH 5.5 5.0 - 8.0   Glucose, UA NEGATIVE NEGATIVE mg/dL   Hgb urine dipstick SMALL (A) NEGATIVE   Bilirubin Urine SMALL (A) NEGATIVE   Ketones, ur 15 (A) NEGATIVE mg/dL   Protein, ur NEGATIVE NEGATIVE mg/dL   Urobilinogen, UA 1.0 0.0 - 1.0 mg/dL   Nitrite NEGATIVE NEGATIVE   Leukocytes, UA NEGATIVE NEGATIVE  Pregnancy, urine     Status: None   Collection Time: 05/05/14  3:21 AM  Result Value Ref Range   Preg  Test, Ur NEGATIVE NEGATIVE  Urine microscopic-add on     Status: Abnormal   Collection Time: 05/05/14  3:21 AM  Result Value Ref Range   Squamous Epithelial / LPF RARE RARE   WBC, UA 3-6 <3 WBC/hpf   RBC / HPF 7-10 <3 RBC/hpf   Bacteria, UA MANY (A) RARE   Urine-Other MUCOUS PRESENT    4:42 AM Patient sleeping comfortably after IV medications. CT scan not done as patient is young and has known bilateral nephrolithiasis.   Hanley SeamenJohn L Claudy Abdallah, MD 05/05/14 [phone]

## 2014-05-05 NOTE — ED Notes (Addendum)
C/o sudden onset of right lower abd pain that radiates into her back that started around 2am. C/o nausea but vomiting started this morning. Vomiting on arrival to ED. Denies any urinary symptoms. States history of kidney stones. Pt unable to sit still and moaning in pain.

## 2014-05-05 NOTE — ED Notes (Signed)
MD with pt  

## 2014-05-05 NOTE — ED Notes (Signed)
Pt moaning and states pain is increasing again. MD aware and orders received.

## 2014-05-06 LAB — URINE CULTURE: Colony Count: 75000

## 2014-05-22 ENCOUNTER — Ambulatory Visit (INDEPENDENT_AMBULATORY_CARE_PROVIDER_SITE_OTHER): Payer: 59 | Admitting: Family Medicine

## 2014-05-22 ENCOUNTER — Encounter: Payer: Self-pay | Admitting: Family Medicine

## 2014-05-22 VITALS — BP 118/74 | HR 122 | Ht 66.0 in | Wt 170.0 lb

## 2014-05-22 DIAGNOSIS — F900 Attention-deficit hyperactivity disorder, predominantly inattentive type: Secondary | ICD-10-CM

## 2014-05-22 DIAGNOSIS — Z0000 Encounter for general adult medical examination without abnormal findings: Secondary | ICD-10-CM | POA: Diagnosis not present

## 2014-05-22 DIAGNOSIS — N200 Calculus of kidney: Secondary | ICD-10-CM | POA: Insufficient documentation

## 2014-05-22 LAB — LIPID PANEL
CHOL/HDL RATIO: 2.8 ratio
Cholesterol: 132 mg/dL (ref 0–200)
HDL: 48 mg/dL (ref >39)
LDL CALC: 74 mg/dL (ref 0–99)
Triglycerides: 52 mg/dL (ref <150)
VLDL: 10 mg/dL (ref 0–40)

## 2014-05-22 MED ORDER — AMPHETAMINE-DEXTROAMPHETAMINE 10 MG PO TABS: 10.0000 mg | ORAL_TABLET | Freq: Every day | ORAL | Status: DC

## 2014-05-22 NOTE — Patient Instructions (Signed)
Let me know if the medicine works, how long does it work and if you have any difficulty with it.

## 2014-05-22 NOTE — Progress Notes (Signed)
   Subjective:    Patient ID: Melanie Mclaughlin, female    DOB: May 18, 1990, 24 y.o.   MRN: [id number]  HPI She presents for a general physical exam. She was seen in the ED 2 wks ago for a kidney stone and states she passed a stone. She has not had any problems since that time. She denies fever, weight change, and GI/GU symptoms. This is her second stone She has been on ADD medication in the past but none for previous 2 years. She has noticed problems focusing and completing her school work and would like to start medication to help with this. She notes that her previous medication did interfere with her sleep and made her slightly hyper. Reviewed family and social history.  She denies being sexually active and reports her menstrual cycles are regular.She has never had a pap smear.   Review of Systems  All other systems reviewed and are negative.      Objective:   Physical Exam  Alert and in no distress. Tympanic membranes and canals are normal. Throat is clear. Tonsils are normal. Neck is supple without adenopathy or thyromegaly. Cardiac exam shows a regular sinus rhythm without murmurs or gallops. Lungs are clear to auscultation. Abdomen is soft, non distended, normal bowel sounds, and without hepatosplenomegaly.  Breast and pelvic exam deferred.        Assessment & Plan:  Renal calculi  Attention deficit hyperactivity disorder (ADHD), predominantly inattentive type - Plan: amphetamine-dextroamphetamine (ADDERALL) 10 MG tablet  Routine general medical examination at a health care facility - Plan: Lipid panel The ED provider recommended that she follow-up with urology and she has an appointment next week. Encouraged physical activity, eating a well-balanced diet and continuing to a healthy lifestyle. Discussed health maintenance and immunizations. She would like to schedule a return visit for pelvic and breast exam.  I will start her out on a lower dose of Adderall. Also discussed the fact that  she might not need a longer acting variety and just use it for when she needs to focus at school .Let me know if the medicine works, how long does it work and if you have any difficulty with it.

## 2016-04-20 DIAGNOSIS — H52223 Regular astigmatism, bilateral: Secondary | ICD-10-CM | POA: Diagnosis not present

## 2016-05-18 ENCOUNTER — Other Ambulatory Visit (INDEPENDENT_AMBULATORY_CARE_PROVIDER_SITE_OTHER): Payer: 59

## 2016-05-18 DIAGNOSIS — Z23 Encounter for immunization: Secondary | ICD-10-CM | POA: Diagnosis not present

## 2016-05-24 ENCOUNTER — Ambulatory Visit (INDEPENDENT_AMBULATORY_CARE_PROVIDER_SITE_OTHER): Payer: 59 | Admitting: Family Medicine

## 2016-05-24 ENCOUNTER — Encounter: Payer: Self-pay | Admitting: Family Medicine

## 2016-05-24 VITALS — BP 110/70 | HR 76 | Resp 18 | Wt 183.2 lb

## 2016-05-24 DIAGNOSIS — R51 Headache: Secondary | ICD-10-CM

## 2016-05-24 DIAGNOSIS — R519 Headache, unspecified: Secondary | ICD-10-CM

## 2016-05-24 DIAGNOSIS — F909 Attention-deficit hyperactivity disorder, unspecified type: Secondary | ICD-10-CM | POA: Diagnosis not present

## 2016-05-24 MED ORDER — METHYLPHENIDATE HCL 10 MG PO TABS: 10.0000 mg | ORAL_TABLET | Freq: Every day | ORAL | 0 refills | Status: DC

## 2016-05-24 NOTE — Patient Instructions (Addendum)
Take the Ritalin and let me know if it works, how long it works and having any trouble with it. Take 2 Aleve twice per day regularly for the next week if that is not working then call in the kitchen to see a headache specialist

## 2016-05-24 NOTE — Progress Notes (Signed)
   Subjective:    Patient ID: Melanie Mclaughlin, female    DOB: [birth year], 26 y.o.   MRN: [id number]  HPI He is here for consult concerning ADHD. She has a previous history of difficulty with this. She was given Adderall one year ago but found that it did help with focus but also caused her to become more irritable and she did not get a refill. She now would like to get on a different medication to help with staying focused. She also has a long history of difficulty with headaches that she dates back to high school. They have been intermittent in nature and usually weekly occurring in the frontal and moving to the occipital area but not necessarily related to stress or head position. She describes them as sharp and throbbing and occasionally have difficulty with photo and phonophobia. Within the last several weeks the headaches been occurring on a daily basis but no associated nausea, vomiting or aura. She has not had any association with her menses.   Review of Systems     Objective:   Physical Exam Alert and in no distress. EOMI other cranial nerves intact. Tympanic membranes and canals are normal. Pharyngeal area is normal. Neck is supple without adenopathy or thyromegaly. Cardiac exam shows a regular sinus rhythm without murmurs or gallops. Lungs are clear to auscultation.        Assessment & Plan:  Attention deficit hyperactivity disorder (ADHD), unspecified ADHD type - Plan: methylphenidate (RITALIN) 10 MG tablet  Chronic nonintractable headache, unspecified headache type I decided to switch her to Ritalin. She will let me know if it works, how long and if she has any problems with this. For her headache I will have her switch to Aleve 2 pills twice per day regularly for the next week and if continued difficulty, refer to headache specialist. Her headaches could be tension in nature but I don't get a strong feeling for that nor migraine.

## 2016-05-27 ENCOUNTER — Telehealth: Payer: Self-pay | Admitting: Family Medicine

## 2016-05-27 NOTE — Telephone Encounter (Signed)
Pt was notified and will call next week

## 2016-05-27 NOTE — Telephone Encounter (Signed)
Have her double the dose and take it for several days then let us know how it is working

## 2016-05-27 NOTE — Telephone Encounter (Signed)
Pt called and  Would like a stronger dosage of her ritalin states it dosen't seem strong enough pt can be reached at [phone] informed pt that you was out of the office

## 2016-11-19 ENCOUNTER — Encounter: Payer: Self-pay | Admitting: Family Medicine

## 2016-11-19 DIAGNOSIS — F900 Attention-deficit hyperactivity disorder, predominantly inattentive type: Secondary | ICD-10-CM

## 2016-11-21 MED ORDER — AMPHETAMINE-DEXTROAMPHETAMINE 10 MG PO TABS: 10.0000 mg | ORAL_TABLET | Freq: Every day | ORAL | 0 refills | Status: DC

## 2016-11-21 MED ORDER — AMPHETAMINE-DEXTROAMPHETAMINE 10 MG PO TABS: 10.0000 mg | ORAL_TABLET | Freq: Two times a day (BID) | ORAL | 0 refills | Status: DC

## 2016-11-21 NOTE — Telephone Encounter (Signed)
Patient was to switch back to Adderall as she states the Ritalin was not as effective

## 2017-12-27 ENCOUNTER — Telehealth: Payer: 59 | Admitting: Family

## 2017-12-27 DIAGNOSIS — R05 Cough: Secondary | ICD-10-CM

## 2017-12-27 DIAGNOSIS — J028 Acute pharyngitis due to other specified organisms: Secondary | ICD-10-CM

## 2017-12-27 DIAGNOSIS — R059 Cough, unspecified: Secondary | ICD-10-CM

## 2017-12-27 MED ORDER — AMOXICILLIN-POT CLAVULANATE 875-125 MG PO TABS: 1.0000 | ORAL_TABLET | Freq: Two times a day (BID) | ORAL | 0 refills | Status: DC

## 2017-12-27 NOTE — Progress Notes (Signed)

## 2018-06-13 ENCOUNTER — Telehealth: Payer: Managed Care, Other (non HMO) | Admitting: Family

## 2018-06-13 DIAGNOSIS — L709 Acne, unspecified: Secondary | ICD-10-CM

## 2018-06-13 MED ORDER — BENZOYL PEROXIDE-ERYTHROMYCIN 5-3 % EX GEL
Freq: Two times a day (BID) | CUTANEOUS | 0 refills | Status: DC
Start: 2018-06-13 — End: 2020-12-01

## 2018-06-13 NOTE — Progress Notes (Signed)
We are sorry that you are experiencing this issue.  Here is how we plan to help!  Based on what you shared with me it looks like you have uncomplicated acne.  Acne is a disorder of the hair follicles and oil glands (sebaceous glands). The sebaceous glands secrete oils to keep the skin moist.  When the glands get clogged, it can lead to pimples or cysts.  These cysts may become infected and leave scars. Acne is very common and normally occurs at puberty.  Acne is also inherited.  Your personal care plan consists of the following recommendations:  I recommend that you use a daily cleanser  You might try an over the counter cleanser that has benzoyl peroxide.  I recommend that you start with a product that has 2.5% benzoyl peroxide.  Stronger concentrations have not been shown to be more effective.  I have prescribed a topical gel with an antibiotic:  Benzoyl peroxide-erythromycin gel.  This gel should be applied to the affected areas twice a day. Be sure to read the package insert for potential side effects.  It sounds like you are also having scaring. I would recommend OTC medication for scaring. You may benefit from follow up with a dermatologists.   Approximately, 5 minutes spent reviewing and documenting in patient's chart.   If excessive dryness or peeling occurs, reduce dose frequency or concentration of the topical scrubs.  If excessive stinging or burning occurs, remove the topical gel with mild soap and water and resume at a lower dose the next day.  Remember oral antibiotics and topical acne treatments may increase your sensitivity to the sun!  HOME CARE:  Do not squeeze pimples because that can often lead to infections, worse acne, and scars.  Use a moisturizer that contains retinoid or fruit acids that may inhibit the development of new acne lesions.  Although there is not a clear link that foods can cause acne, doctors do believe that too many sweets predispose you to skin  problems.  GET HELP RIGHT AWAY IF:  If your acne gets worse or is not better within 10 days.  If you become depressed.  If you become pregnant, discontinue medications and call your OB/GYN.  MAKE SURE YOU:  Understand these instructions.  Will watch your condition.  Will get help right away if you are not doing well or get worse.   Your e-visit answers were reviewed by a board certified advanced clinical practitioner to complete your personal care plan.  Depending upon the condition, your plan could have included both over the counter or prescription medications.  Please review your pharmacy choice.  If there is a problem, you may contact your provider through [employer] and have the prescription routed to another pharmacy.  Your safety is important to [place].  If you have drug allergies check your prescription carefully.  For the next 24 hours you can use MyChart to ask questions about today's visit, request a non-urgent call back, or ask for a work or school excuse from your e-visit provider.  You will get an email in the next two days asking about your experience. I hope that your e-visit has been valuable and will speed your recovery.

## 2018-12-04 ENCOUNTER — Other Ambulatory Visit (HOSPITAL_BASED_OUTPATIENT_CLINIC_OR_DEPARTMENT_OTHER): Payer: Self-pay | Admitting: Emergency Medicine

## 2018-12-04 ENCOUNTER — Other Ambulatory Visit (HOSPITAL_BASED_OUTPATIENT_CLINIC_OR_DEPARTMENT_OTHER): Payer: Managed Care, Other (non HMO)

## 2018-12-04 ENCOUNTER — Emergency Department (HOSPITAL_BASED_OUTPATIENT_CLINIC_OR_DEPARTMENT_OTHER)
Admission: EM | Admit: 2018-12-04 | Discharge: 2018-12-04 | Disposition: A | Discharge status: Home / Self Care | Payer: Managed Care, Other (non HMO) | Attending: Emergency Medicine | Admitting: Emergency Medicine

## 2018-12-04 ENCOUNTER — Inpatient Hospital Stay (HOSPITAL_BASED_OUTPATIENT_CLINIC_OR_DEPARTMENT_OTHER): Admit: 2018-12-04 | Payer: Managed Care, Other (non HMO)

## 2018-12-04 ENCOUNTER — Other Ambulatory Visit: Payer: Self-pay

## 2018-12-04 ENCOUNTER — Encounter (HOSPITAL_BASED_OUTPATIENT_CLINIC_OR_DEPARTMENT_OTHER): Payer: Self-pay | Admitting: Emergency Medicine

## 2018-12-04 DIAGNOSIS — Z79899 Other long term (current) drug therapy: Secondary | ICD-10-CM | POA: Insufficient documentation

## 2018-12-04 DIAGNOSIS — R109 Unspecified abdominal pain: Secondary | ICD-10-CM

## 2018-12-04 DIAGNOSIS — R52 Pain, unspecified: Secondary | ICD-10-CM

## 2018-12-04 DIAGNOSIS — N200 Calculus of kidney: Secondary | ICD-10-CM | POA: Insufficient documentation

## 2018-12-04 LAB — CBC WITH DIFFERENTIAL/PLATELET
Abs Immature Granulocytes: 0.01 10*3/uL (ref 0.00–0.07)
Basophils Absolute: 0 10*3/uL (ref 0.0–0.1)
Basophils Relative: 0 %
Eosinophils Absolute: 0.1 10*3/uL (ref 0.0–0.5)
Eosinophils Relative: 1 %
HCT: 39.1 % (ref 36.0–46.0)
Hemoglobin: 12.6 g/dL (ref 12.0–15.0)
Immature Granulocytes: 0 %
Lymphocytes Relative: 43 %
Lymphs Abs: 2.7 10*3/uL (ref 0.7–4.0)
MCH: 29 pg (ref 26.0–34.0)
MCHC: 32.2 g/dL (ref 30.0–36.0)
MCV: 89.9 fL (ref 80.0–100.0)
Monocytes Absolute: 0.5 10*3/uL (ref 0.1–1.0)
Monocytes Relative: 9 %
Neutro Abs: 2.9 10*3/uL (ref 1.7–7.7)
Neutrophils Relative %: 47 %
Platelets: 300 10*3/uL (ref 150–400)
RBC: 4.35 MIL/uL (ref 3.87–5.11)
RDW: 13.1 % (ref 11.5–15.5)
WBC: 6.2 10*3/uL (ref 4.0–10.5)
nRBC: 0 % (ref 0.0–0.2)

## 2018-12-04 LAB — URINALYSIS, MICROSCOPIC (REFLEX): RBC / HPF: >50 RBC/hpf (ref 0–5)

## 2018-12-04 LAB — BASIC METABOLIC PANEL
Anion gap: 9 (ref 5–15)
BUN: 10 mg/dL (ref 6–20)
CO2: 23 mmol/L (ref 22–32)
Calcium: 9 mg/dL (ref 8.9–10.3)
Chloride: 106 mmol/L (ref 98–111)
Creatinine, Ser: 1.12 mg/dL — ABNORMAL HIGH (ref 0.44–1.00)
GFR calc Af Amer: >60 mL/min (ref >60)
GFR calc non Af Amer: >60 mL/min (ref >60)
Glucose, Bld: 112 mg/dL — ABNORMAL HIGH (ref 70–99)
Potassium: 3.6 mmol/L (ref 3.5–5.1)
Sodium: 138 mmol/L (ref 135–145)

## 2018-12-04 LAB — URINALYSIS, ROUTINE W REFLEX MICROSCOPIC
Bilirubin Urine: NEGATIVE
Glucose, UA: NEGATIVE mg/dL
Ketones, ur: NEGATIVE mg/dL
Leukocytes,Ua: NEGATIVE
Nitrite: NEGATIVE
Protein, ur: 100 mg/dL — AB
Specific Gravity, Urine: >1.03 — ABNORMAL HIGH (ref 1.005–1.030)
pH: 5.5 (ref 5.0–8.0)

## 2018-12-04 LAB — PREGNANCY, URINE: Preg Test, Ur: NEGATIVE

## 2018-12-04 MED ORDER — MORPHINE SULFATE (PF) 4 MG/ML IV SOLN
4.0000 mg | Freq: Once | INTRAVENOUS | Status: AC
  Administered 2018-12-04: 4 mg via INTRAVENOUS
  Filled 2018-12-04: qty 1

## 2018-12-04 MED ORDER — OXYCODONE-ACETAMINOPHEN 5-325 MG PO TABS: 1.0000 | ORAL_TABLET | Freq: Four times a day (QID) | ORAL | 0 refills | Status: DC | PRN

## 2018-12-04 MED ORDER — KETOROLAC TROMETHAMINE 30 MG/ML IJ SOLN
30.0000 mg | Freq: Once | INTRAMUSCULAR | Status: AC
  Administered 2018-12-04: 30 mg via INTRAVENOUS
  Filled 2018-12-04: qty 1

## 2018-12-04 MED ORDER — TAMSULOSIN HCL 0.4 MG PO CAPS: 0.4000 mg | ORAL_CAPSULE | Freq: Every day | ORAL | 0 refills | Status: DC

## 2018-12-04 MED ORDER — ONDANSETRON 4 MG PO TBDP: 4.0000 mg | ORAL_TABLET | Freq: Three times a day (TID) | ORAL | 0 refills | Status: DC | PRN

## 2018-12-04 MED ORDER — OXYCODONE-ACETAMINOPHEN 5-325 MG PO TABS
1.0000 | ORAL_TABLET | Freq: Once | ORAL | Status: AC
  Administered 2018-12-04: 1 via ORAL
  Filled 2018-12-04: qty 1

## 2018-12-04 MED ORDER — SODIUM CHLORIDE 0.9 % IV BOLUS
1000.0000 mL | Freq: Once | INTRAVENOUS | Status: AC
  Administered 2018-12-04: 1000 mL via INTRAVENOUS

## 2018-12-04 MED ORDER — ONDANSETRON HCL 4 MG/2ML IJ SOLN
4.0000 mg | Freq: Once | INTRAMUSCULAR | Status: AC
  Administered 2018-12-04: 4 mg via INTRAVENOUS
  Filled 2018-12-04: qty 2

## 2018-12-04 NOTE — ED Triage Notes (Signed)
Pt is c/o left flank pain that started about 2am  Pt has nausea without vomiting  Pt has hx of kidney stones in the past

## 2018-12-04 NOTE — ED Notes (Signed)
ED Provider at bedside. 

## 2018-12-04 NOTE — ED Provider Notes (Signed)
[facility] Provider Note   CSN: [id number] Arrival date & time: 12/04/18  0405    History   Chief Complaint Chief Complaint  Patient presents with  . Flank Pain    HPI Melanie Mclaughlin is a 28 y.o. female.     HPI  This is a 28 year old female with a history of kidney stones who presents with left flank pain.  Patient reports onset of symptoms at 2 AM.  She reports left flank and abdominal pain that is sharp.  Nothing seems to make the pain better or worse.  She currently rates her pain 8 out of 10.  She did not take anything for her pain.  She has not noted any hematuria or dysuria.  She feels her pain is consistent with her prior kidney stones.  She reports nausea without vomiting.  No fevers.  Past Medical History:  Diagnosis Date  . Allergy    RHINITIS  . Ureterolithiasis     Patient Active Problem List   Diagnosis Date Noted  . Renal calculi 05/22/2014  . ADHD (attention deficit hyperactivity disorder) 10/27/2010    History reviewed. No pertinent surgical history.   OB History   No obstetric history on file.      Home Medications    Prior to Admission medications   Medication Sig Start Date End Date Taking? Authorizing Provider  amoxicillin-clavulanate (AUGMENTIN) 875-125 MG tablet Take 1 tablet by mouth 2 (two) times daily. 12/27/17   Dutch Quint B, FNP  amphetamine-dextroamphetamine (ADDERALL) 10 MG tablet Take 1 tablet (10 mg total) by mouth daily. 11/21/16   Denita Lung, MD  amphetamine-dextroamphetamine (ADDERALL) 10 MG tablet Take 1 tablet (10 mg total) by mouth 2 (two) times daily. 12/22/16   Denita Lung, MD  amphetamine-dextroamphetamine (ADDERALL) 10 MG tablet Take 1 tablet (10 mg total) by mouth 2 (two) times daily. 01/21/17   Denita Lung, MD  benzoyl peroxide-erythromycin [facility]) gel Apply topically 2 (two) times daily. 06/13/18   Evelina Dun A, FNP  ondansetron (ZOFRAN ODT) 4 MG disintegrating tablet  Take 1 tablet (4 mg total) by mouth every 8 (eight) hours as needed. 12/04/18   Horton, Barbette Hair, MD  oxyCODONE-acetaminophen (PERCOCET/ROXICET) 5-325 MG tablet Take 1 tablet by mouth every 6 (six) hours as needed for severe pain. 12/04/18   Horton, Barbette Hair, MD  tamsulosin (FLOMAX) 0.4 MG CAPS capsule Take 1 capsule (0.4 mg total) by mouth daily. 12/04/18   Horton, Barbette Hair, MD    Family History Family History  Problem Relation Age of Onset  . Asthma Sister   . Cancer Maternal Aunt   . Hypertension Maternal Grandmother   . Diabetes Maternal Grandmother   . Diabetes type II Father     Social History Social History   Tobacco Use  . Smoking status: Never Smoker  . Smokeless tobacco: Never Used  Substance Use Topics  . Alcohol use: No  . Drug use: No     Allergies   Patient has no known allergies.   Review of Systems Review of Systems  Constitutional: Negative for fever.  Respiratory: Negative for shortness of breath.   Cardiovascular: Negative for chest pain.  Gastrointestinal: Positive for abdominal pain and nausea. Negative for constipation, diarrhea and vomiting.  Genitourinary: Positive for flank pain. Negative for dysuria and hematuria.  All other systems reviewed and are negative.    Physical Exam Updated Vital Signs BP (!) 134/94 (BP Location: Left Arm)   Pulse Marland Kitchen)  113   Temp 98.3 F (36.8 C) (Oral)   Resp (!) 22   Ht 1.702 m (5\' 7" )   Wt 90.7 kg   LMP 11/30/2018 (Exact Date)   SpO2 100%   BMI 31.32 kg/m   Physical Exam Vitals signs and nursing note reviewed.  Constitutional:      Appearance: She is well-developed.     Comments: Uncomfortable appearing but nontoxic  HENT:     Head: Normocephalic and atraumatic.     Mouth/Throat:     Mouth: Mucous membranes are moist.  Eyes:     Pupils: Pupils are equal, round, and reactive to light.  Neck:     Musculoskeletal: Neck supple.  Cardiovascular:     Rate and Rhythm: Normal rate and regular  rhythm.     Heart sounds: Normal heart sounds.  Pulmonary:     Effort: Pulmonary effort is normal. No respiratory distress.     Breath sounds: No wheezing.  Abdominal:     General: Bowel sounds are normal.     Palpations: Abdomen is soft.     Tenderness: There is no abdominal tenderness. There is no right CVA tenderness or left CVA tenderness.  Musculoskeletal:     Right lower leg: No edema.     Left lower leg: No edema.  Skin:    General: Skin is warm and dry.  Neurological:     Mental Status: She is alert and oriented to person, place, and time.  Psychiatric:        Mood and Affect: Mood normal.      ED Treatments / Results  Labs (all labs ordered are listed, but only abnormal results are displayed) Labs Reviewed  URINALYSIS, ROUTINE W REFLEX MICROSCOPIC - Abnormal; Notable for the following components:      Result Value   Color, Urine RED (*)    APPearance HAZY (*)    Specific Gravity, Urine >1.030 (*)    Hgb urine dipstick LARGE (*)    Protein, ur 100 (*)    All other components within normal limits  BASIC METABOLIC PANEL - Abnormal; Notable for the following components:   Glucose, Bld 112 (*)    Creatinine, Ser 1.12 (*)    All other components within normal limits  URINALYSIS, MICROSCOPIC (REFLEX) - Abnormal; Notable for the following components:   Bacteria, UA FEW (*)    All other components within normal limits  PREGNANCY, URINE  CBC WITH DIFFERENTIAL/PLATELET    EKG None  Radiology No results found.  Procedures Procedures (including critical care time)  Medications Ordered in ED Medications  oxyCODONE-acetaminophen (PERCOCET/ROXICET) 5-325 MG per tablet 1 tablet (has no administration in time range)  ketorolac (TORADOL) 30 MG/ML injection 30 mg (30 mg Intravenous Given 12/04/18 0438)  sodium chloride 0.9 % bolus 1,000 mL (0 mLs Intravenous Stopped 12/04/18 0542)  morphine 4 MG/ML injection 4 mg (4 mg Intravenous Given 12/04/18 0440)  ondansetron  (ZOFRAN) injection 4 mg (4 mg Intravenous Given 12/04/18 0437)     Initial Impression / Assessment and Plan / ED Course  I have reviewed the triage vital signs and the nursing notes.  Pertinent labs & imaging results that were available during my care of the patient were reviewed by me and considered in my medical decision making (see chart for details).        Patient presents with left flank pain.  Symptoms are consistent with her prior kidney stones.  She is overall nontoxic-appearing.  She is uncomfortable appearing.  Slightly tachycardic but afebrile.  No reproducible pain on exam.  Patient was given pain medication, nausea medication, fluids.  Basic lab work-up is reassuring including kidney function.  She does have large blood in her urine.  Given her history of kidney stones, would like to avoid CT at this time.  Will order renal ultrasound for later today.  On recheck, she states she feels somewhat better.  She is able to tolerate fluids.  Recommend supportive measures at home and watchful waiting.  She will return for ultrasound later today.  After history, exam, and medical workup I feel the patient has been appropriately medically screened and is safe for discharge home. Pertinent diagnoses were discussed with the patient. Patient was given return precautions.  Final Clinical Impressions(s) / ED Diagnoses   Final diagnoses:  Flank pain  Kidney stone    ED Discharge Orders         Ordered    US Renal     12/04/18 0619    ondansetron (ZOFRAN ODT) 4 MG disintegrating tablet  Every 8 hours PRN     12/04/18 0619    oxyCODONE-acetaminophen (PERCOCET/ROXICET) 5-325 MG tablet  Every 6 hours PRN     12/04/18 0619    tamsulosin (FLOMAX) 0.4 MG CAPS capsule  Daily     12/04/18 0619           Shon BatonHorton, Courtney F, MD 12/04/18 [phone]

## 2018-12-07 ENCOUNTER — Other Ambulatory Visit: Payer: Self-pay

## 2018-12-07 ENCOUNTER — Ambulatory Visit (INDEPENDENT_AMBULATORY_CARE_PROVIDER_SITE_OTHER): Payer: Self-pay | Admitting: Family Medicine

## 2018-12-07 ENCOUNTER — Encounter: Payer: Self-pay | Admitting: Family Medicine

## 2018-12-07 VITALS — Ht 67.0 in | Wt 198.0 lb

## 2018-12-07 DIAGNOSIS — N200 Calculus of kidney: Secondary | ICD-10-CM

## 2018-12-07 MED ORDER — OXYCODONE-ACETAMINOPHEN 5-325 MG PO TABS: 1.0000 | ORAL_TABLET | Freq: Four times a day (QID) | ORAL | 0 refills | Status: DC | PRN

## 2018-12-07 NOTE — Progress Notes (Signed)
   Subjective:    Patient ID: Melanie Mclaughlin, female    DOB: [birth year], 28 y.o.   MRN: [id number]  HPI Documentation for virtual telephone encounter. Documentation for virtual audio and video telecommunications through doximity encounter: The patient was located at home. The provider was located in the office. The patient did consent to this visit and is aware of possible charges through their insurance for this visit. The other persons participating in this telemedicine service were none. Time spent on call was 5 minutes and in review of previous records >10 minutes total.  This virtual service is not related to other E/M service within previous 7 days. She was seen on August 18 in the emergency room with graduation and treatment of kidney stone.  She has a previous history of difficulty with stones and did have them evaluated.  Apparently they did show calcium urate stones.  She complains of left flank pain and does state that the pain has moved.  They did give her oxycodone, Zofran and Flomax.  She still having pain but seems to be under better control than earlier today.    Review of Systems     Objective:   Physical Exam Alert and in no distress but complaining of left flank pain.       Assessment & Plan:  Renal stone - Plan: oxyCODONE-acetaminophen (PERCOCET/ROXICET) 5-325 MG tablet She has a good handle on how to handle stones she has had difficulty with the past.  She will keep her self hydrated, continue on her present medications and call me if her symptoms worsen otherwise I will see her in several weeks to reassess how she is doing.  She was comfortable with that.

## 2018-12-10 ENCOUNTER — Ambulatory Visit (INDEPENDENT_AMBULATORY_CARE_PROVIDER_SITE_OTHER): Payer: Self-pay | Admitting: Family Medicine

## 2018-12-10 ENCOUNTER — Encounter: Payer: Self-pay | Admitting: Family Medicine

## 2018-12-10 VITALS — BP 126/82 | HR 100 | Temp 97.5°F | Wt 198.0 lb

## 2018-12-10 DIAGNOSIS — N200 Calculus of kidney: Secondary | ICD-10-CM

## 2018-12-10 MED ORDER — OXYCODONE-ACETAMINOPHEN 10-325 MG PO TABS: 1.0000 | ORAL_TABLET | Freq: Four times a day (QID) | ORAL | 0 refills | Status: AC | PRN

## 2018-12-10 MED ORDER — ONDANSETRON 4 MG PO TBDP
4.0000 mg | ORAL_TABLET | Freq: Once | ORAL | Status: AC
  Administered 2018-12-10: 4 mg via ORAL

## 2018-12-10 MED ORDER — KETOROLAC TROMETHAMINE 60 MG/2ML IM SOLN
60.0000 mg | Freq: Once | INTRAMUSCULAR | Status: AC
  Administered 2018-12-10: 14:00:00 60 mg via INTRAMUSCULAR

## 2018-12-10 NOTE — Progress Notes (Signed)
   Subjective:    Patient ID: Melanie Mclaughlin, female    DOB: 1990/07/14, 28 y.o.   MRN: [id number]  HPI She has a history of renal stone.  She was essentially pain-free Saturday and Sunday however this morning the pain reoccurred in the left flank as well as in the left lower quadrant.  Same pain as she had before.  She is also having some nausea.   Review of Systems     Objective:   Physical Exam Alert and in disstress calm complaining of abdominal and flank pain.       Assessment & Plan:  Renal stone - Plan: oxyCODONE-acetaminophen (PERCOCET) 10-325 MG tablet I will give her Toradol 60 mg as well as ondansetron.  I will give her higher dosing of oxycodone.  She is to continue to drink plenty of fluids.  Discussed appropriate follow-up with her.

## 2018-12-13 ENCOUNTER — Other Ambulatory Visit: Payer: Self-pay

## 2018-12-13 DIAGNOSIS — Z20822 Contact with and (suspected) exposure to covid-19: Secondary | ICD-10-CM

## 2018-12-15 LAB — NOVEL CORONAVIRUS, NAA: SARS-CoV-2, NAA: NOT DETECTED

## 2019-01-16 ENCOUNTER — Other Ambulatory Visit: Payer: Self-pay

## 2019-01-16 ENCOUNTER — Encounter: Payer: Self-pay | Admitting: Medical

## 2019-01-16 ENCOUNTER — Ambulatory Visit (INDEPENDENT_AMBULATORY_CARE_PROVIDER_SITE_OTHER): Payer: Self-pay | Admitting: Medical

## 2019-01-16 VITALS — BP 110/70 | HR 110 | Temp 98.2°F | Ht 67.0 in | Wt 200.6 lb

## 2019-01-16 DIAGNOSIS — Z87442 Personal history of urinary calculi: Secondary | ICD-10-CM | POA: Insufficient documentation

## 2019-01-16 DIAGNOSIS — R109 Unspecified abdominal pain: Secondary | ICD-10-CM

## 2019-01-16 DIAGNOSIS — R112 Nausea with vomiting, unspecified: Secondary | ICD-10-CM

## 2019-01-16 LAB — POCT UA - MICROSCOPIC ONLY: RBC, urine, microscopic: 2

## 2019-01-16 LAB — BASIC METABOLIC PANEL
BUN/Creatinine Ratio: 10 (ref 9–23)
BUN: 13 mg/dL (ref 6–20)
CO2: 23 mmol/L (ref 20–29)
Calcium: 9.1 mg/dL (ref 8.7–10.2)
Chloride: 103 mmol/L (ref 96–106)
Creatinine, Ser: 1.26 mg/dL — ABNORMAL HIGH (ref 0.57–1.00)
GFR calc Af Amer: 67 mL/min/{1.73_m2} (ref >59)
GFR calc non Af Amer: 59 mL/min/{1.73_m2} — ABNORMAL LOW (ref >59)
Glucose: 92 mg/dL (ref 65–99)
Potassium: 4.2 mmol/L (ref 3.5–5.2)
Sodium: 137 mmol/L (ref 134–144)

## 2019-01-16 LAB — POCT URINALYSIS DIP (PROADVANTAGE DEVICE)
Glucose, UA: NEGATIVE mg/dL
Leukocytes, UA: NEGATIVE
Nitrite, UA: NEGATIVE
Protein Ur, POC: 30 mg/dL — AB
Specific Gravity, Urine: 1.025
Urobilinogen, Ur: NEGATIVE
pH, UA: 6 (ref 5.0–8.0)

## 2019-01-16 MED ORDER — HYDROCODONE-ACETAMINOPHEN 7.5-325 MG PO TABS: 1.0000 | ORAL_TABLET | Freq: Four times a day (QID) | ORAL | 0 refills | Status: AC | PRN

## 2019-01-16 MED ORDER — ONDANSETRON HCL 4 MG PO TABS: 4.0000 mg | ORAL_TABLET | Freq: Three times a day (TID) | ORAL | 0 refills | Status: DC | PRN

## 2019-01-16 MED ORDER — PROMETHAZINE HCL 12.5 MG PO TABS: 12.5000 mg | ORAL_TABLET | Freq: Three times a day (TID) | ORAL | 0 refills | Status: DC | PRN

## 2019-01-16 NOTE — Addendum Note (Signed)
Addended by: Edgar Frisk on: 01/16/2019 12:49 PM   Modules accepted: Orders

## 2019-01-16 NOTE — Patient Instructions (Signed)
You may use EITHER Zofran OR promethazine for nausea and vomiting.  Zofran will not make you sleepy.   Promethazine is stronger but will cause drowsiness.  You can use either one every 6 hours for nausea, vomiting.  You can use the Norco/Hydrocodone for pain as needed.  This can cause drowsiness as well  Strain the urine and collect any stone debris in a sterile urine cup and bring this into our office.   We will call with lab results and possible scan.     Kidney Stones  Kidney stones (urolithiasis) are solid, rock-like deposits that form inside of the organs that make urine (kidneys). A kidney stone may form in a kidney and move into the bladder, where it can cause intense pain and block the flow of urine. Kidney stones are created when high levels of certain minerals are found in the urine. They are usually passed through urination, but in some cases, medical treatment may be needed to remove them. What are the causes? Kidney stones may be caused by:  A condition in which certain glands produce too much parathyroid hormone (primary hyperparathyroidism), which causes too much calcium buildup in the blood.  Buildup of uric acid crystals in the bladder (hyperuricosuria). Uric acid is a chemical that the body produces when you eat certain foods. It usually exits the body in the urine.  Narrowing (stricture) of one or both of the tubes that drain urine from the kidneys to the bladder (ureters).  A kidney blockage that is present at birth (congenital obstruction).  Past surgery on the kidney or the ureters, such as gastric bypass surgery. What increases the risk? The following factors make you more likely to develop kidney stones:  Having had a kidney stone in the past.  Having a family history of kidney stones.  Not drinking enough water.  Eating a diet that is high in protein, salt (sodium), or sugar.  Being overweight or obese. What are the signs or symptoms? Symptoms of a kidney  stone may include:  Nausea.  Vomiting.  Blood in the urine (hematuria).  Pain in the side of the abdomen, right below the ribs (flank pain). Pain usually spreads (radiates) to the groin.  Needing to urinate frequently or urgently. How is this diagnosed? This condition may be diagnosed based on:  Your medical history.  A physical exam.  Blood tests.  Urine tests.  CT scan.  Abdominal X-ray.  A procedure to examine the inside of the bladder (cystoscopy). How is this treated? Treatment for kidney stones depends on the size, location, and makeup of the stones. Treatment may involve:  Analyzing your urine before and after you pass the stone through urination.  Being monitored at the hospital until you pass the stone through urination.  Increasing your fluid intake and decreasing the amount of calcium and protein in your diet.  A procedure to break up kidney stones in the bladder using: ? A focused beam of light (laser therapy). ? Shock waves (extracorporeal shock wave lithotripsy).  Surgery to remove kidney stones. This may be needed if you have severe pain or have stones that block your urinary tract. Follow these instructions at home: Eating and drinking  Drink enough fluid to keep your urine clear or pale yellow. This will help you to pass the kidney stone.  If directed, change your diet. This may include: ? Limiting how much sodium you eat. ? Eating more fruits and vegetables. ? Limiting how much meat, poultry, fish, and  eggs you eat.  Follow instructions from your health care provider about eating or drinking restrictions. General instructions  Collect urine samples as told by your health care provider. You may need to collect a urine sample: ? 24 hours after you pass the stone. ? 8-12 weeks after passing the kidney stone, and every 6-12 months after that.  Strain your urine every time you urinate, for as long as directed. Use the strainer that your health  care provider recommends.  Do not throw out the kidney stone after passing it. Keep the stone so it can be tested by your health care provider. Testing the makeup of your kidney stone may help prevent you from getting kidney stones in the future.  Take over-the-counter and prescription medicines only as told by your health care provider.  Keep all follow-up visits as told by your health care provider. This is important. You may need follow-up X-rays or ultrasounds to make sure that your stone has passed. How is this prevented? To prevent another kidney stone:  Drink enough fluid to keep your urine clear or pale yellow. This is the best way to prevent kidney stones.  Eat a healthy diet and follow recommendations from your health care provider about foods to avoid. You may be instructed to eat a low-protein diet. Recommendations vary depending on the type of kidney stone that you have.  Maintain a healthy weight. Contact a health care provider if:  You have pain that gets worse or does not get better with medicine. Get help right away if:  You have a fever or chills.  You develop severe pain.  You develop new abdominal pain.  You faint.  You are unable to urinate. This information is not intended to replace advice given to you by your health care provider. Make sure you discuss any questions you have with your health care provider. Document Released: 04/04/2005 Document Revised: 11/14/2017 Document Reviewed: 09/18/2015 Elsevier Patient Education  2020 [employer].

## 2019-01-16 NOTE — Progress Notes (Signed)
Subjective: Chief Complaint  Patient presents with  . Abdominal Pain    bottom of left side x1 month worsening this week    Here for pain, nausea, started a month ago.   It finally went away, but then pain is back again.  Thought it was a kidney stone, but no stone ever passed.  Not on any current medications.  She notes 4 days hx/o pain in left mid to left lower abdomen, hurts to stand straight, nausea from the pain, pain awakes her in the morning at 2am.  Has vomited about once daily the last 4 days.   Has been trying to limit eating due to pain and has loss of appetite.   Pain currently not radiating to back.    Has some burning with urination.   No polyuria, no odor in urine, no cloudy urine.   Last BM today, but had to strain.  Lately going every 2-3 days with BM in recent weeks.  She was on oxycodone in august for similar symptoms of kidney stone, so attributes constipation to pain medication.  Prior to last visit here in August with Dr. Redmond School doesn't normally have problems with bowels.   No fever.   No vaginal bleeding, no pelvic pain.   LMP 12/28/18, periods are regular, heavy.    Has had history of kidney stones, last 2015 prior to one suspected in August.  Has had stones twice prior.    Not on birth control.   She is a virgin, no prior sexual activity.   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  No other aggravating or relieving factors. No other complaint.   Past Medical History:  Diagnosis Date  . Allergy    RHINITIS  . Ureterolithiasis    No past surgical history on file.     Objective: BP 110/70   Pulse (!) 110   Temp 98.2 F (36.8 C)   Ht 5\' 7"  (1.702 m)   Wt 200 lb 9.6 oz (91 kg)   SpO2 90%   BMI 31.42 kg/m   Wt Readings from Last 3 Encounters:  01/16/19 200 lb 9.6 oz (91 kg)  12/10/18 198 lb (89.8 kg)  12/07/18 198 lb (89.8 kg)   Gen: wd, wn, nad Skin unremarkable  Lungs clear Heart rrr, normal s1, s2, no murmurs Abdomen: +bs, somewhat reduced,  moderate tenderness left mid and lower abdomen, no rebound, no other tenderness, no mass, no organomegaly Back: +left CVA tenderness Ext: no edema Pulses WNL     Assessment: Encounter Diagnoses  Name Primary?  . Left sided abdominal pain Yes  . History of renal calculi   . Nausea and vomiting, intractability of vomiting not specified, unspecified vomiting type      Plan: Discussed symptoms, possible causes, very likely renal stone.   You may use EITHER Zofran OR promethazine for nausea and vomiting.  Zofran will not make you sleepy.   Promethazine is stronger but will cause drowsiness.  You can use either one every 6 hours for nausea, vomiting.  You can use the Norco/Hydrocodone for pain as needed.  This can cause drowsiness as well  Strain the urine and collect any stone debris in a sterile urine cup and bring this into our office.   We will call with lab results and possible scan.   Rozetta was seen today for abdominal pain.  Diagnoses and all orders for this visit:  Left sided abdominal pain -     POCT Urinalysis DIP (Proadvantage Device) -  Basic metabolic panel  History of renal calculi -     Basic metabolic panel  Nausea and vomiting, intractability of vomiting not specified, unspecified vomiting type -     Basic metabolic panel  Other orders -     HYDROcodone-acetaminophen (NORCO) 7.5-325 MG tablet; Take 1 tablet by mouth every 6 (six) hours as needed for up to 5 days for moderate pain. -     ondansetron (ZOFRAN) 4 MG tablet; Take 1 tablet (4 mg total) by mouth every 8 (eight) hours as needed for nausea or vomiting. -     promethazine (PHENERGAN) 12.5 MG tablet; Take 1 tablet (12.5 mg total) by mouth every 8 (eight) hours as needed for nausea or vomiting.

## 2019-01-18 ENCOUNTER — Ambulatory Visit: Payer: Self-pay | Admitting: Family Medicine

## 2019-01-21 ENCOUNTER — Other Ambulatory Visit: Payer: Self-pay | Admitting: Urology

## 2019-01-21 DIAGNOSIS — N200 Calculus of kidney: Secondary | ICD-10-CM

## 2019-01-29 ENCOUNTER — Other Ambulatory Visit: Payer: Self-pay

## 2019-01-29 ENCOUNTER — Encounter (HOSPITAL_COMMUNITY): Payer: Self-pay

## 2019-01-29 ENCOUNTER — Ambulatory Visit (HOSPITAL_COMMUNITY)
Admission: RE | Admit: 2019-01-29 | Discharge: 2019-01-29 | Disposition: A | Discharge status: Home / Self Care | Payer: Self-pay | Source: Ambulatory Visit | Attending: Urology | Admitting: Urology

## 2019-01-29 DIAGNOSIS — N200 Calculus of kidney: Secondary | ICD-10-CM | POA: Insufficient documentation

## 2019-02-04 ENCOUNTER — Telehealth: Payer: Self-pay | Admitting: Family

## 2019-02-04 DIAGNOSIS — L0291 Cutaneous abscess, unspecified: Secondary | ICD-10-CM

## 2019-02-04 MED ORDER — SULFAMETHOXAZOLE-TRIMETHOPRIM 800-160 MG PO TABS: 1.0000 | ORAL_TABLET | Freq: Two times a day (BID) | ORAL | 0 refills | Status: DC

## 2019-02-04 NOTE — Progress Notes (Signed)
E Visit for Cellulitis  We are sorry that you are not feeling well. Here is how we plan to help!  Based on what you shared with me it looks like you have cellulitis.  Cellulitis looks like areas of skin redness, swelling, and warmth; it develops as a result of bacteria entering under the skin. Little red spots and/or bleeding can be seen in skin, and tiny surface sacs containing fluid can occur. Fever can be present. Cellulitis is almost always on one side of a body, and the lower limbs are the most common site of involvement.   I have prescribed:  Bactrim DS 1 tablet by mouth BID for 7 days. You need to call and follow up with your PCP in the next 2-4 days. Warm compresses and not pick or squeeze area.   HOME CARE:  . Take your medications as ordered and take all of them, even if the skin irritation appears to be healing.   GET HELP RIGHT AWAY IF:  . Symptoms that don't begin to go away within 48 hours. . Severe redness persists or worsens . If the area turns color, spreads or swells. . If it blisters and opens, develops yellow-brown crust or bleeds. . You develop a fever or chills. . If the pain increases or becomes unbearable.  . Are unable to keep fluids and food down.  MAKE SURE YOU    Understand these instructions.  Will watch your condition.  Will get help right away if you are not doing well or get worse.  Thank you for choosing an e-visit. Your e-visit answers were reviewed by a board certified advanced clinical practitioner to complete your personal care plan. Depending upon the condition, your plan could have included both over the counter or prescription medications. Please review your pharmacy choice. Make sure the pharmacy is open so you can pick up prescription now. If there is a problem, you may contact your provider through [employer] and have the prescription routed to another pharmacy. Your safety is important to [place]. If you have drug allergies check your  prescription carefully.  For the next 24 hours you can use MyChart to ask questions about today's visit, request a non-urgent call back, or ask for a work or school excuse. You will get an email in the next two days asking about your experience. I hope that your e-visit has been valuable and will speed your recovery.   Approximately 5 minutes was spent documenting and reviewing patient's chart.

## 2019-03-12 ENCOUNTER — Other Ambulatory Visit: Payer: Self-pay

## 2019-03-12 DIAGNOSIS — Z20822 Contact with and (suspected) exposure to covid-19: Secondary | ICD-10-CM

## 2019-03-14 LAB — NOVEL CORONAVIRUS, NAA: SARS-CoV-2, NAA: NOT DETECTED

## 2019-11-21 ENCOUNTER — Telehealth: Payer: Self-pay | Admitting: Physician Assistant

## 2019-11-21 DIAGNOSIS — N898 Other specified noninflammatory disorders of vagina: Secondary | ICD-10-CM

## 2019-11-21 MED ORDER — METRONIDAZOLE 500 MG PO TABS: 500.0000 mg | ORAL_TABLET | Freq: Two times a day (BID) | ORAL | 0 refills | Status: AC

## 2019-11-21 NOTE — Progress Notes (Addendum)
We are sorry that you are not feeling well. Here is how we plan to help! Based on what you shared with me it looks like you: May have a vaginosis due to bacteria  Vaginosis is an inflammation of the vagina that can result in discharge, itching and pain. The cause is usually a change in the normal balance of vaginal bacteria or an infection. Vaginosis can also result from reduced estrogen levels after menopause.  The most common causes of vaginosis are:   Bacterial vaginosis which results from an overgrowth of one on several organisms that are normally present in your vagina.   Yeast infections which are caused by a naturally occurring fungus called candida.   Vaginal atrophy (atrophic vaginosis) which results from the thinning of the vagina from reduced estrogen levels after menopause.   Trichomoniasis which is caused by a parasite and is commonly transmitted by sexual intercourse.  Factors that increase your risk of developing vaginosis include: Marland Kitchen Medications, such as antibiotics and steroids . Uncontrolled diabetes . Use of hygiene products such as bubble bath, vaginal spray or vaginal deodorant . Douching . Wearing damp or tight-fitting clothing . Using an intrauterine device (IUD) for birth control . Hormonal changes, such as those associated with pregnancy, birth control pills or menopause . Sexual activity . Having a sexually transmitted infection  Your treatment plan is Metronidazole or Flagyl 500mg  twice a day for 7 days.  I have electronically sent this prescription into the pharmacy that you have chosen. I also recommend going to your primary care for testing of any sexually transmitted diseases.  Be sure to take all of the medication as directed. Stop taking any medication if you develop a rash, tongue swelling or shortness of breath. Mothers who are breast feeding should consider pumping and discarding their breast milk while on these antibiotics. However, there is no  consensus that infant exposure at these doses would be harmful.  Remember that medication creams can weaken latex condoms.  This appointment required 5 minutes of patient care (this includes precharting, chart review, review of results, documentation, etc.).

## 2020-05-21 ENCOUNTER — Ambulatory Visit: Payer: Self-pay

## 2020-10-20 ENCOUNTER — Ambulatory Visit: Payer: Self-pay

## 2020-12-01 ENCOUNTER — Other Ambulatory Visit: Payer: Self-pay

## 2020-12-01 ENCOUNTER — Ambulatory Visit (INDEPENDENT_AMBULATORY_CARE_PROVIDER_SITE_OTHER): Payer: BC Managed Care – PPO | Admitting: Medical

## 2020-12-01 VITALS — BP 120/70 | HR 60 | Wt 209.2 lb

## 2020-12-01 DIAGNOSIS — F902 Attention-deficit hyperactivity disorder, combined type: Secondary | ICD-10-CM | POA: Diagnosis not present

## 2020-12-01 DIAGNOSIS — Z136 Encounter for screening for cardiovascular disorders: Secondary | ICD-10-CM

## 2020-12-01 MED ORDER — METHYLPHENIDATE HCL ER (CD) 20 MG PO CPCR: 20.0000 mg | ORAL_CAPSULE | ORAL | 0 refills | Status: DC

## 2020-12-01 NOTE — Progress Notes (Signed)
Subjective:  Melanie Mclaughlin is a 30 y.o. female who presents for Chief Complaint  Patient presents with   discuss adderrall    Not currently on adderall     Here today to start back on medication for attention deficit.  She has history of ADD.  Diagnosed in middle school.  She recalls only diagnosis of ADD, no learning disability or other mental health diagnosis.  She has been seen at this office in the past for ADD with Dr. Susann Givens here, although she has not been on the medication recently.  Been working at a daycare the past 5 years.   Just recently started a new job.   Been there 3 weeks and having trouble focus.  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   She is in training, and having trouble with focus, easily distracted.  The last time she was in school she had similar problems with focus and distraction.     Last medication was 2018.  She has been on Adderall XR in the past.  She has had some headaches in the past with Adderall. She didn't like the capsules in the past, as she had worse side effects.     Exercising with her sister.  Lately had been using caffeine to stay focused, self medicating, but this makes her anxious   Feels fine otherwise.   Mood is good.  She has some anxiety about the new job and requirements, but otherwise in usual state of health.    Denies chest pain, sleep issues, or current headaches.  No other aggravating or relieving factors.    No other c/o.  Past Medical History:  Diagnosis Date   Allergy    RHINITIS   Ureterolithiasis    No current outpatient medications on file prior to visit.   No current facility-administered medications on file prior to visit.   Family History  Problem Relation Age of Onset   Asthma Sister    Cancer Maternal Aunt    Hypertension Maternal Grandmother    Diabetes Maternal Grandmother    Diabetes type II Father      The following portions of the patient's history were reviewed and updated as  appropriate: allergies, current medications, past family history, past medical history, past social history, past surgical history and problem list.  ROS Otherwise as in subjective above   Objective: BP 120/70   Pulse 60   Wt 209 lb 3.2 oz (94.9 kg)   BMI 32.77 kg/m   General appearance: alert, no distress, well developed, well nourished, African American female Neck: supple, no lymphadenopathy, no thyromegaly, no masses Heart: RRR, normal S1, S2, no murmurs Lungs: CTA bilaterally, no wheezes, rhonchi, or rales Pulses: 2+ radial pulses, 2+ pedal pulses, normal cap refill Ext: no edema Psych: pleasant, good eye contact, answers questions appropriately    Adult ECG Report  Indication:   Rate: 101 bpm  Rhythm: sinus tachycardia  QRS Axis: 52 degrees  PR Interval:  QRS Duration: 47ms  QTc:  Conduction Disturbances: none  Other Abnormalities: none  Patient's cardiac risk factors are: high risk medication  EKG comparison: none  Narrative Interpretation: mild tachycardia likely due to anxioiusness     Assessment: Encounter Diagnoses  Name Primary?   Attention deficit hyperactivity disorder (ADHD), combined type Yes   Screening for heart disease      Plan: Discussed risks and benefits of medication Begin trial of medication below.  Formerly had been on Adderall back  in 2018.  XR Adderall gave headaches at 20mg , and she didn't see benefits of medication at 10mg  I recommended them having a consistent day to day routine with work and household responsibilities  Counseled on health diet, limiting alcohol, caffeine, sugary drinks and avoid energy drinks Counseled on time management, setting realistic goals, using a calendar, organizational tools They have been advised to protect their medication, not let others use their medication, to use as directed and discussed, and that this type of medication is a controlled substance.  Thus, we will continue to see them for  routine medication check ups and monitoring.     Melanie Mclaughlin was seen today for discuss adderrall.  Diagnoses and all orders for this visit:  Attention deficit hyperactivity disorder (ADHD), combined type -     EKG 12-Lead  Screening for heart disease  Other orders -     methylphenidate (METADATE CD) 20 MG CR capsule; Take 1 capsule (20 mg total) by mouth every morning.   Follow up: 19mo

## 2020-12-09 IMAGING — CT CT ABD-PELV W/O CM
2 of 4 series · 16 of 46 positions shown, 18 images · non-contrast
Comparison: 08/28/2013

CLINICAL DATA: Left lower quadrant pain

EXAM:
CT ABDOMEN AND PELVIS WITHOUT CONTRAST
TECHNIQUE: Multidetector CT imaging of the abdomen and pelvis was performed
following the standard protocol without IV contrast.

[Series 2: axial st · axial · 0.70mm/px · z∈[-352,+93]mm · 13 of 101 slices shown, 15 images]
[im 6/101  soft-tissue]
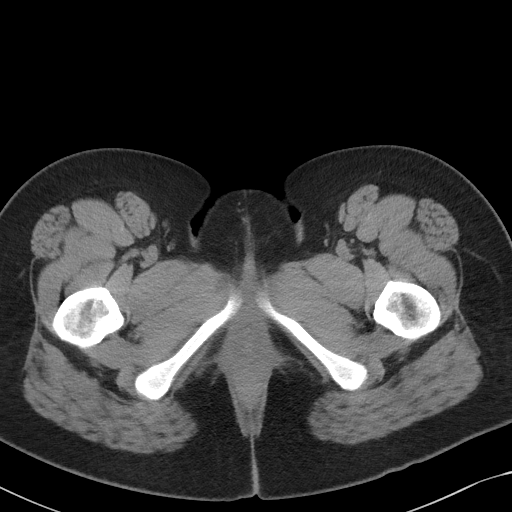
[im 6/101  bone]
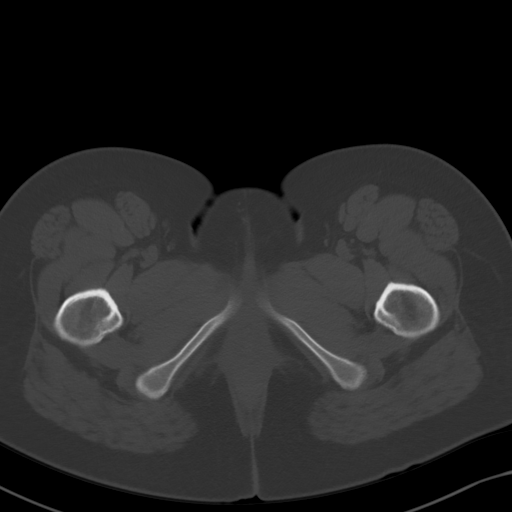
[im 16/101  soft-tissue]
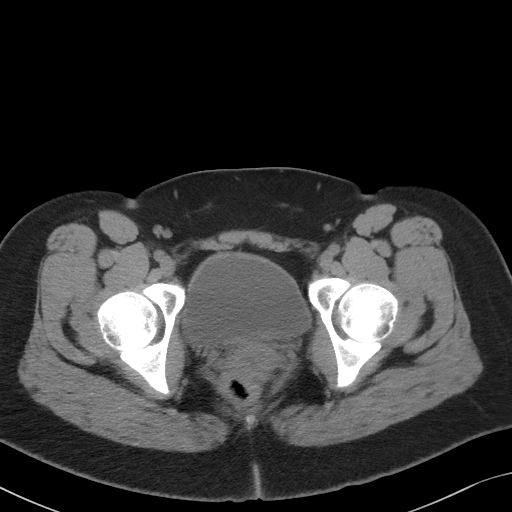
[im 22/101  soft-tissue]
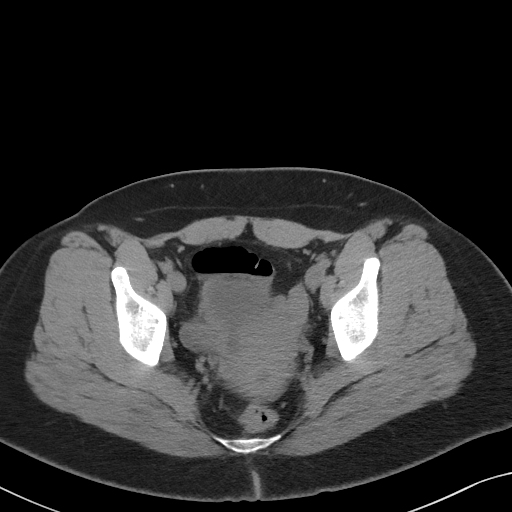
[im 27/101  soft-tissue]
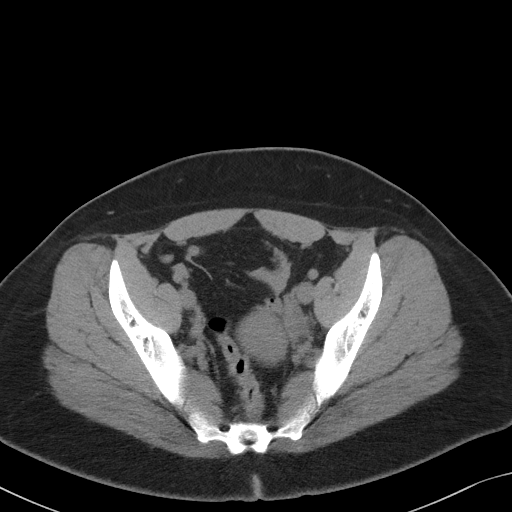
[im 37/101  soft-tissue]
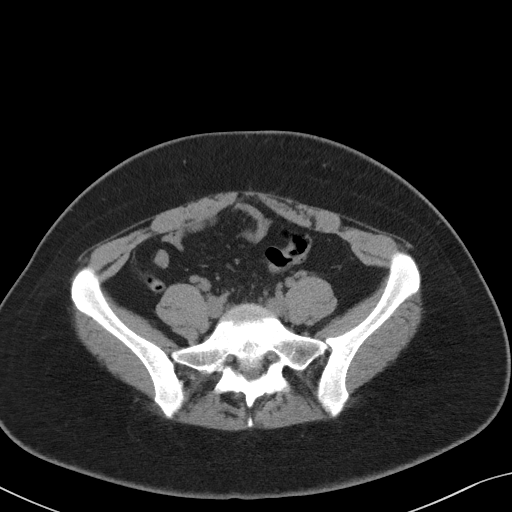
[im 43/101  soft-tissue]
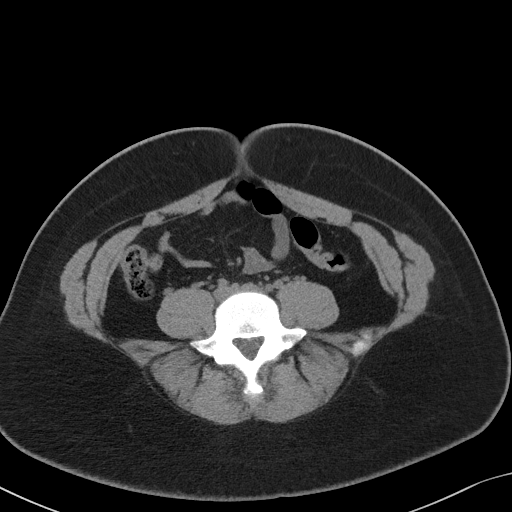
[im 53/101  soft-tissue]
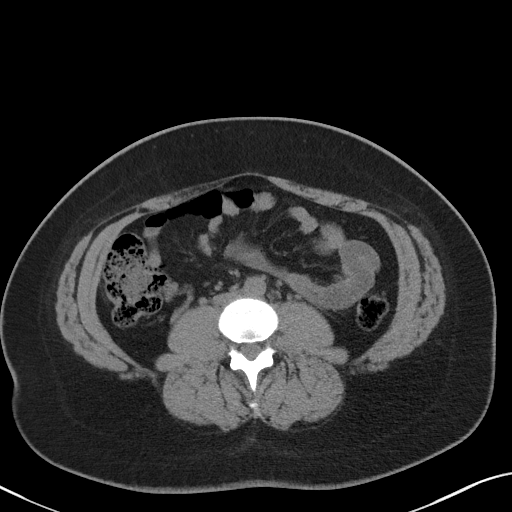
[im 58/101  soft-tissue]
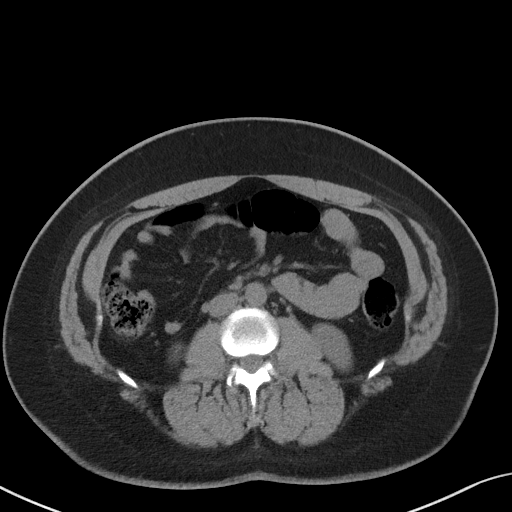
[im 64/101  soft-tissue]
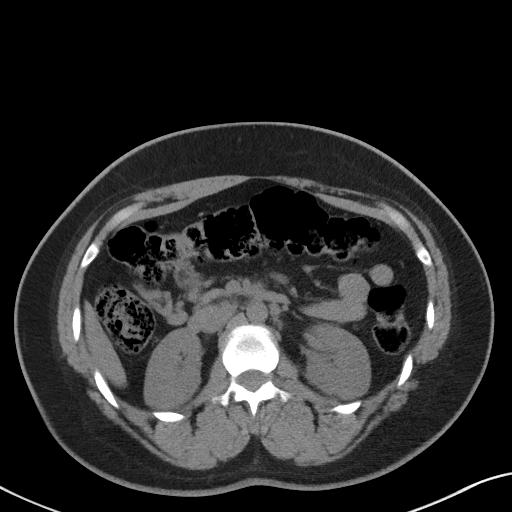
[im 64/101  bone]
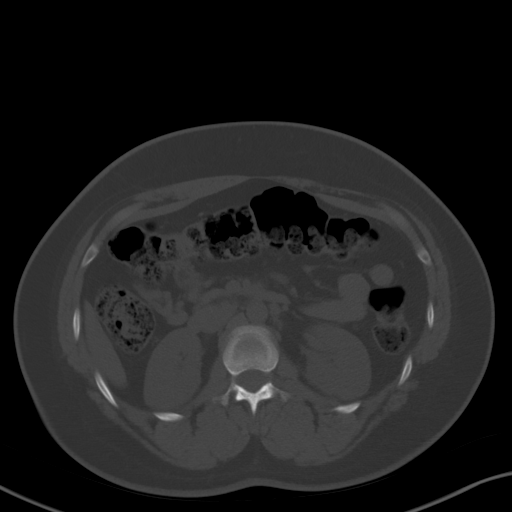
[im 74/101  soft-tissue]
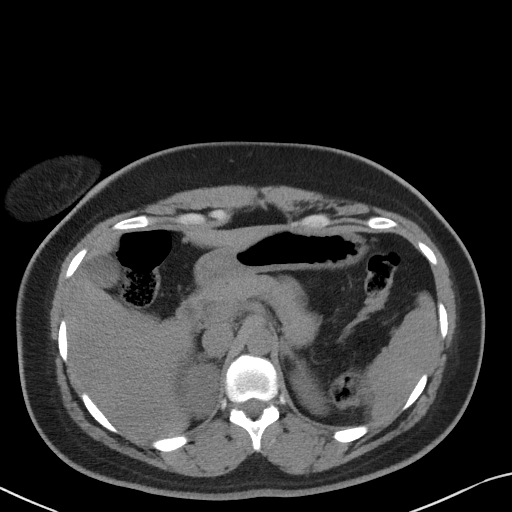
[im 79/101  soft-tissue]
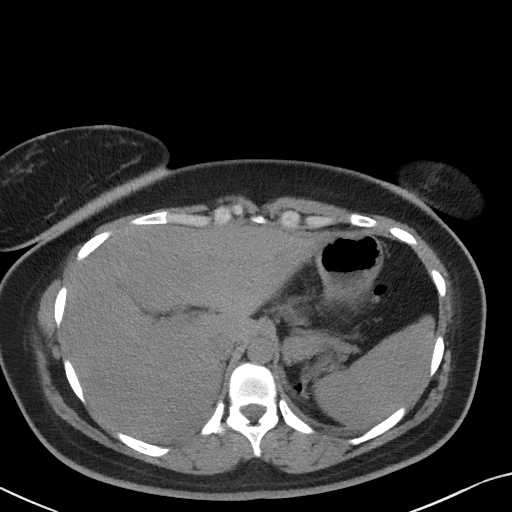
[im 85/101  soft-tissue]
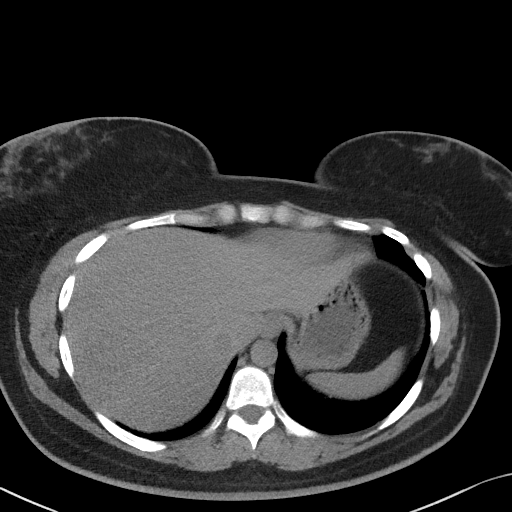
[im 95/101  soft-tissue]
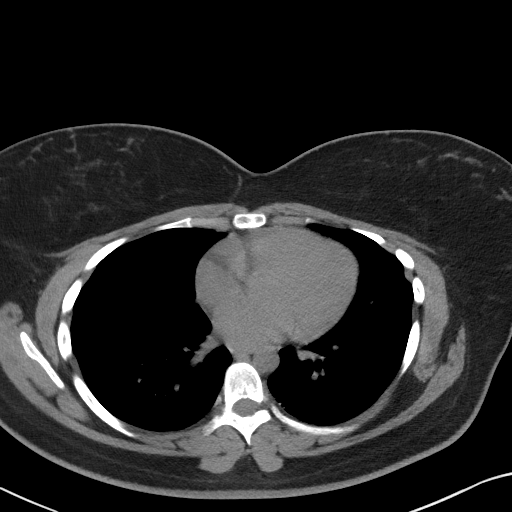

[Series 4: coronal st · coronal · 1.12mm/px · 3 of 77 slices shown]
[im 26/77  soft-tissue]
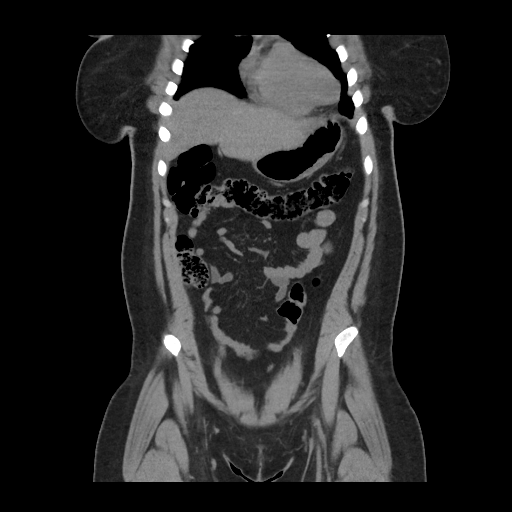
[im 34/77  soft-tissue]
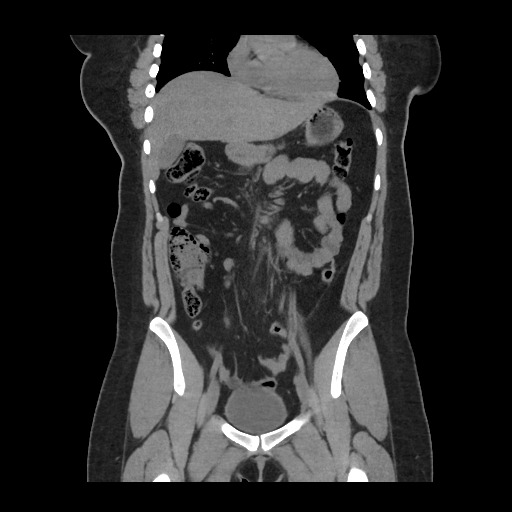
[im 43/77  soft-tissue]
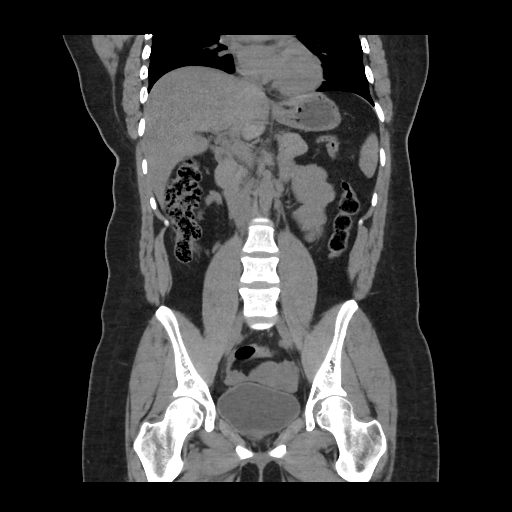

[16 of 46 positions shown; findings below may reference images not displayed]

FINDINGS: Lower chest: No acute abnormality. Micro nodular interstitial
abnormality identified within both lung bases. The appearance is
nonspecific and may be postinflammatory or infectious in etiology.

Hepatobiliary: Unchanged appearance of 4 mm low-density structure
within segment 7 of the liver which is too small to reliably
characterize. Within the limitations of unenhanced technique there
is no suspicious liver abnormality. The gallbladder is unremarkable.
No biliary ductal dilatation.

Pancreas: Unremarkable. No pancreatic ductal dilatation or
surrounding inflammatory changes.

Spleen: Normal in size without focal abnormality.

Adrenals/Urinary Tract: Normal appearance of the adrenal glands.
Possible, punctate 1-2 mm stone within the upper pole of left kidney
noted. Similarly, there is a tiny punctate density within the
inferior pole of the right kidney which may also represent a small
stone. No hydronephrosis, hydroureter or ureteral stone identified.
Urinary bladder is unremarkable.

Stomach/Bowel: Stomach is within normal limits. Appendix appears
normal. No evidence of bowel wall thickening, distention, or
inflammatory changes.

Vascular/Lymphatic: No significant vascular findings are present. No
enlarged abdominal or pelvic lymph nodes.

Reproductive: Uterus and bilateral adnexa are unremarkable.

Other: No abdominal wall hernia or abnormality. No abdominopelvic
ascites.

Musculoskeletal: No acute or significant osseous findings.
IMPRESSION: 1. No acute findings within the abdomen or pelvis.
2. Possible tiny 1-2 mm renal calculi noted within the upper pole of
left kidney and inferior pole of right kidney. No hydronephrosis,
hydroureter or ureteral calculi noted.
3. Nonspecific micro nodular interstitial abnormality within both
lung bases. Etiology indeterminate. Consider follow-up imaging with
nonemergent high-resolution CT of the chest.

## 2021-01-04 ENCOUNTER — Ambulatory Visit: Payer: BC Managed Care – PPO | Admitting: Medical

## 2021-01-08 ENCOUNTER — Telehealth: Payer: Self-pay | Admitting: Physician Assistant

## 2021-01-08 DIAGNOSIS — S025XXB Fracture of tooth (traumatic), initial encounter for open fracture: Secondary | ICD-10-CM

## 2021-01-08 NOTE — Progress Notes (Signed)
Virtual Visit Consent   Melanie Mclaughlin, you are scheduled for a virtual visit with a [facility] provider today.     Just as with appointments in the office, your consent must be obtained to participate.  Your consent will be active for this visit and any virtual visit you may have with one of our providers in the next 365 days.     If you have a MyChart account, a copy of this consent can be sent to you electronically.  All virtual visits are billed to your insurance company just like a traditional visit in the office.    As this is a virtual visit, video technology does not allow for your provider to perform a traditional examination.  This may limit your provider's ability to fully assess your condition.  If your provider identifies any concerns that need to be evaluated in person or the need to arrange testing (such as labs, EKG, etc.), we will make arrangements to do so.     Although advances in technology are sophisticated, we cannot ensure that it will always work on either your end or our end.  If the connection with a video visit is poor, the visit may have to be switched to a telephone visit.  With either a video or telephone visit, we are not always able to ensure that we have a secure connection.     I need to obtain your verbal consent now.   Are you willing to proceed with your visit today?    Melanie Mclaughlin has provided verbal consent on 01/08/2021 for a virtual visit (video or telephone).   [name], PA-C   Date: 01/08/2021 10:37 AM   Virtual Visit via Video Note   I, [name], connected with  Melanie Mclaughlin  ([id number], [birth year]) on 01/08/21 at 10:30 AM EDT by a video-enabled telemedicine application and verified that I am speaking with the correct person using two identifiers.  Location: Patient: Virtual Visit Location Patient: Mobile Provider: Virtual Visit Location Provider: Home Office   I discussed the limitations of evaluation and  management by telemedicine and the availability of in person appointments. The patient expressed understanding and agreed to proceed.    History of Present Illness: Melanie Mclaughlin is a 30 y.o. who identifies as a female who was assigned female at birth, and is being seen today for broken tooth. Patient reports that last night while eating the back half of one of her molars broke off exposing the nerve root. She has had difficulty eating a drinking since due to pain. She has tried ibuprofen 800mg  without any relief. She has contacted her dentist and they are closed. She also contacted an emergency dentist and they were closed as well.   Problems:  Patient Active Problem List   Diagnosis Date Noted   Attention deficit hyperactivity disorder (ADHD), combined type 12/01/2020   Screening for heart disease 12/01/2020   Left sided abdominal pain 01/16/2019   History of renal calculi 01/16/2019   Nausea and vomiting 01/16/2019   Renal calculi 05/22/2014   ADHD (attention deficit hyperactivity disorder) 10/27/2010    Allergies: No Known Allergies Medications:  Current Outpatient Medications:    methylphenidate (METADATE CD) 20 MG CR capsule, Take 1 capsule (20 mg total) by mouth every morning., Disp: 30 capsule, Rfl: 0  Observations/Objective: Patient is well-developed, well-nourished in no acute distress.  Resting comfortably in car parked safely  Head is normocephalic, atraumatic.  No labored breathing.  Speech is clear and coherent with logical content.  Patient is alert and oriented at baseline.  Patient in obvious pain holding right jaw, not moving mouth much with talking  Assessment and Plan: 1. Open fracture of tooth, initial encounter  - Patient with severe pain with broken tooth. Advised to be seen at [facility] or ER for pain management and possible consideration of tooth putty to cover the broken tooth until she can be seen by a dentist.  Follow Up Instructions: I discussed the  assessment and treatment plan with the patient. The patient was provided an opportunity to ask questions and all were answered. The patient agreed with the plan and demonstrated an understanding of the instructions.  A copy of instructions were sent to the patient via MyChart unless otherwise noted below.    The patient was advised to call back or seek an in-person evaluation if the symptoms worsen or if the condition fails to improve as anticipated.  Time:  I spent 8 minutes with the patient via telehealth technology discussing the above problems/concerns.    [name], PA-C

## 2021-01-08 NOTE — Patient Instructions (Signed)
Melanie Mclaughlin, thank you for joining [name], PA-C for today's virtual visit.  While this provider is not your primary care provider (PCP), if your PCP is located in our provider database this encounter information will be shared with them immediately following your visit.  Consent: (Patient) Melanie Mclaughlin provided verbal consent for this virtual visit at the beginning of the encounter.  Current Medications:  Current Outpatient Medications:    methylphenidate (METADATE CD) 20 MG CR capsule, Take 1 capsule (20 mg total) by mouth every morning., Disp: 30 capsule, Rfl: 0   Medications ordered in this encounter:  No orders of the defined types were placed in this encounter.    *If you need refills on other medications prior to your next appointment, please contact your pharmacy*  Follow-Up: Call back or seek an in-person evaluation if the symptoms worsen or if the condition fails to improve as anticipated.  Other Instructions Tooth Injuries Tooth injuries include cracked or broken teeth, teeth that have been dislodged or moved out of place, and teeth that have been knocked out of the mouth. Severe tooth injuries need to be treated quickly to save the tooth. However, sometimes it is not possible to save a tooth after an injury, and the tooth may need to be removed. What are the causes? Tooth injuries may be caused by any force that is strong enough to chip, break, dislodge, or knock out a tooth. The injuries may come from: Sports accidents. Falls. Fights. What increases the risk? The following factors may make you more likely to lose a tooth: Playing contact sports, such as football or boxing, without using a mouth guard. Any medical condition that increases the risk of falling or fainting. Anything that causes injury to the face. Any condition that reduces the support of the root of the tooth. What are the signs or symptoms? Symptoms of this condition include a  tooth that: May have moved out of position. May have moved into or out of the tooth socket. May not be visible in the gums, if the fracture was severe. Other symptoms of a tooth injury include: Pain, especially when chewing. A loose tooth. Bleeding in or around the tooth. Swelling or bruising near the tooth. Swelling or bruising of the lip over the injured tooth. Increased tooth sensitivity to heat and cold. A tooth that is knocked out of its place in the gum. How is this diagnosed? A tooth injury can be diagnosed with a complete history and a physical exam. You may also need dental X-rays to check for injuries to the root of the tooth. How is this treated? Treatment depends on the type of injury and its severity. Treatment may need to be done quickly to save your tooth. Possible treatments include: Replacing a tooth fragment with a filling, a cap, or a hard, protective cover (crown). This may be an option for a chip or fracture that does not affect the inside of your tooth. Repairing the inside of the tooth (root canal), if the dentist thinks it is necessary. The root canal usually needs to be done within a few days of the injury. This may be done to treat a tooth fracture that affects the pulp. Repositioning a dislodged tooth. Using a brace or splint to hold the tooth in place. Replacing a knocked-out tooth in the socket, if possible, and then doing a root canal. Extracting a tooth. This is done for a fracture that extends below the gums or a fracture  that splits the tooth completely. Taking medicine, including: Pain medicine. Antibiotic medicine to help prevent infection. Follow these instructions at home: Medicines Take over-the-counter and prescription medicines only as told by your dentist. Take your antibiotic medicine as told by your dentist. Do not stop taking the antibiotic even if you start to feel better. Do not drive or use heavy machinery while taking prescription pain  medicine. Caring for your teeth  Do not eat or chew on very hard objects. These include ice cubes, pens, pencils, hard candy, and popcorn kernels. Do not clench or grind your teeth. Tell your dentist if you grind your teeth while you sleep. Brush your teeth gently as directed by your dentist. Do not use your teeth to open packages. Always wear mouth protection when you play contact sports. Managing pain and swelling Gargle with a mixture of salt and water 3-4 times a day or as needed. To make salt water, completely dissolve -1 tsp (3-6 g) of salt in 1 cup (237 mL) of warm water. If directed, apply ice to your mouth near the injured tooth: Put ice in a plastic bag. Place a towel between your skin and the bag. Leave the ice on for 20 minutes, 2-3 times a day. Remove the ice if your skin turns bright red. This is very important. If you cannot feel pain, heat, or cold, you have a greater risk of damage to the area. General instructions Do not use any products that contain nicotine or tobacco. These products include cigarettes, chewing tobacco, and vaping devices, such as e-cigarettes. If you need help quitting, ask your health care provider. Your health care provider may recommend that you eat certain foods. This may include eating only soft foods. Check the injured area every day for signs of infection. Watch for: Redness, swelling, or pain. Fluid, blood, or pus. Keep all follow-up visits. This is important. Contact a dental care provider if: Your pain gets much worse, even after you take pain medicine. You have pus coming from the site of the tooth injury. You develop swelling near your injured tooth. You have a tooth splint, and it becomes loose. Your tooth becomes loose. Get help right away if: You have swelling in the face. You have a fever. You have bleeding near the tooth that does not stop in 10 minutes. You have trouble swallowing. You have trouble opening your mouth. Your  permanent tooth comes out after it is repositioned. Summary Tooth injuries include cracked or broken teeth and teeth that have been dislodged or knocked out of the mouth. A tooth injury can be diagnosed with a medical history and a physical exam. You may also need dental X-rays to check for injuries to the root of the tooth. Treatment depends on the type of injury you have and its severity. Treatment may need to be done quickly to save your tooth. This information is not intended to replace advice given to you by your health care provider. Make sure you discuss any questions you have with your health care provider. Document Revised: 12/11/2019 Document Reviewed: 12/11/2019 Elsevier Patient Education  2022 [employer].    If you have been instructed to have an in-person evaluation today at a local Urgent Care facility, please use the link below. It will take you to a list of all of our available [facility], including address, phone number and hours of operation. Please do not delay care.   Urgent Cares  If you or a family member  do not have a primary care provider, use the link below to schedule a visit and establish care. When you choose a [facility] primary care physician or advanced practice provider, you gain a long-term partner in health. Find a Primary Care Provider  Learn more about [facility]'s in-office and virtual care options: [facility]

## 2021-05-19 DIAGNOSIS — Z111 Encounter for screening for respiratory tuberculosis: Secondary | ICD-10-CM | POA: Diagnosis not present

## 2021-05-21 DIAGNOSIS — Z021 Encounter for pre-employment examination: Secondary | ICD-10-CM | POA: Diagnosis not present

## 2021-05-21 DIAGNOSIS — Z111 Encounter for screening for respiratory tuberculosis: Secondary | ICD-10-CM | POA: Diagnosis not present

## 2021-06-03 ENCOUNTER — Telehealth: Payer: BC Managed Care – PPO | Admitting: Family Medicine

## 2021-06-03 DIAGNOSIS — L089 Local infection of the skin and subcutaneous tissue, unspecified: Secondary | ICD-10-CM

## 2021-06-03 MED ORDER — SULFAMETHOXAZOLE-TRIMETHOPRIM 800-160 MG PO TABS: 1.0000 | ORAL_TABLET | Freq: Two times a day (BID) | ORAL | 0 refills | Status: AC

## 2021-06-03 NOTE — Progress Notes (Signed)
E Visit for Cellulitis  We are sorry that you are not feeling well. Here is how we plan to help!  Based on what you shared with me it looks like you have cellulitis.  Cellulitis looks like areas of skin redness, swelling, and warmth; it develops as a result of bacteria entering under the skin. Little red spots and/or bleeding can be seen in skin, and tiny surface sacs containing fluid can occur. Fever can be present. Cellulitis is almost always on one side of a body, and the lower limbs are the most common site of involvement.   I have prescribed:  Bactrim DS 1 tablet by mouth twice a day for 7 days  HOME CARE:  Take your medications as ordered and take all of them, even if the skin irritation appears to be healing.   GET HELP RIGHT AWAY IF:  Symptoms that don't begin to go away within 48 hours. Severe redness persists or worsens If the area turns color, spreads or swells. If it blisters and opens, develops yellow-brown crust or bleeds. You develop a fever or chills. If the pain increases or becomes unbearable.  Are unable to keep fluids and food down.  MAKE SURE YOU   Understand these instructions. Will watch your condition. Will get help right away if you are not doing well or get worse.  Thank you for choosing an e-visit.  Your e-visit answers were reviewed by a board certified advanced clinical practitioner to complete your personal care plan. Depending upon the condition, your plan could have included both over the counter or prescription medications.  Please review your pharmacy choice. Make sure the pharmacy is open so you can pick up prescription now. If there is a problem, you may contact your provider through MyChart messaging and have the prescription routed to another pharmacy.  Your safety is important to us. If you have drug allergies check your prescription carefully.   For the next 24 hours you can use MyChart to ask questions about today's visit, request a  non-urgent call back, or ask for a work or school excuse. You will get an email in the next two days asking about your experience. I hope that your e-visit has been valuable and will speed your recovery.  I provided 5 minutes of non face-to-face time during this encounter for chart review, medication and order placement, as well as and documentation.   

## 2021-08-06 ENCOUNTER — Telehealth (INDEPENDENT_AMBULATORY_CARE_PROVIDER_SITE_OTHER): Payer: Self-pay | Admitting: Medical

## 2021-08-06 ENCOUNTER — Telehealth: Payer: Self-pay | Admitting: Medical

## 2021-08-06 VITALS — Wt 200.0 lb

## 2021-08-06 DIAGNOSIS — N910 Primary amenorrhea: Secondary | ICD-10-CM

## 2021-08-06 DIAGNOSIS — Z3009 Encounter for other general counseling and advice on contraception: Secondary | ICD-10-CM

## 2021-08-06 DIAGNOSIS — Z7184 Encounter for health counseling related to travel: Secondary | ICD-10-CM

## 2021-08-06 DIAGNOSIS — Z7185 Encounter for immunization safety counseling: Secondary | ICD-10-CM

## 2021-08-06 DIAGNOSIS — Z124 Encounter for screening for malignant neoplasm of cervix: Secondary | ICD-10-CM

## 2021-08-06 MED ORDER — NORETHINDRONE ACETATE 5 MG PO TABS: 5.0000 mg | ORAL_TABLET | Freq: Three times a day (TID) | ORAL | 0 refills | Status: DC

## 2021-08-06 NOTE — Telephone Encounter (Signed)
Waiting on pharmacy to call back ?

## 2021-08-06 NOTE — Progress Notes (Signed)
?Subjective:  ?  ? Patient ID: Melanie Mclaughlin, female   DOB: 1990/11/05, 31 y.o.   MRN: [id number] ? ?This visit type was conducted due to national recommendations for restrictions regarding the COVID-19 Pandemic (e.g. social distancing) in an effort to limit this patient's exposure and mitigate transmission in our community.  Due to their co-morbid illnesses, this patient is at least at moderate risk for complications without adequate follow up.  This format is felt to be most appropriate for this patient at this time.   ? ?Documentation for virtual audio and video telecommunications through [place] encounter: ? ?The patient was located at home. ?The provider was located in the office. ?The patient did consent to this visit and is aware of possible charges through their insurance for this visit. ? ?The other persons participating in this telemedicine service were none. ?Time spent on call was 20 minutes and in review of previous records 20 minutes total. ? ?This virtual service is not related to other E/M service within previous 7 days. ? ? ?HPI ?Chief Complaint  ?Patient presents with  ? would like to have her period delayed  ?  Would like to have her period delayed while she is on vacation  ? ?Going on vacation soon.   Menstrual period is suppose to come 08/17/21.   She leaves for vacation 08/17/21 for a wedding to [place].   Not currently on birth control.   Never been on birth control.  Does not necessarily want to be on birth control. ? ?No prior Pap smear. ? ?Nonsmoker. ? ?No self or family history of blood clot, stroke, miscarriages.   ? ? ? ?Review of Systems ?As in subjective ?   ?Objective:  ? Physical Exam ?Due to coronavirus pandemic stay at home measures, patient visit was virtual and they were not examined in person.   ?Gen: wd, wn, nad, African-American female ? ?   ?Assessment:  ?   ?Encounter Diagnoses  ?Name Primary?  ? Contraceptive education Yes  ? Delayed period   ? Travel advice encounter   ?  Screening for cervical cancer   ? Vaccine counseling   ? ? ?   ?Plan:  ?   ? ?Advise she return soon for physical, breast and pelvic exam and Pap smear.  No prior Pap smear.  We discussed the importance of routine cervical cancer screening. ? ?We discussed the benefits and risk of birth control in general.  We discussed possible types of birth control.  She will consider birth control in general. ? ?She mainly wants something to stop her period for her upcoming trip to [place].   ? ?We discussed risk and benefits of medication for short-term use.  Begin medication below.  I counseled with local gynecologist on options for this short term therapy.   There are not a lot of data on options, but of the potential options, this one is preferred for short term use as in this situation. ? ?I recommend condom use ? ?Also discussed HPV vaccine.   ? ?[name] was seen today for would like to have her period delayed. ? ?Diagnoses and all orders for this visit: ? ?Contraceptive education ? ?Delayed period ? ?Travel advice encounter ? ?Screening for cervical cancer ? ?Vaccine counseling ? ?Other orders ?-     norethindrone (AYGESTIN) 5 MG tablet; Take 1 tablet (5 mg total) by mouth in the morning, at noon, and at bedtime. Start 3 days prior to your upcoming period ? ?Return soon  for physical, pap ?

## 2021-08-06 NOTE — Telephone Encounter (Signed)
I called her pharmacy but it does not open till 2 PM  ? ?please call her pharmacy.  I was going to prescribe Norethisterone, 3 tablets daily, starting 3 days prior to her vacation to delay her period, taking this 3 tablets daily for about 8 days. ? ?I cannot find that particular medication in epic. ? ?Ask pharmacist if I can use Norethindrone instead, using this 3 tablets daily for 8 days instead.  If not what medicine today recommend and what is the instructions for taking this? ? ? ?

## 2021-12-12 ENCOUNTER — Telehealth: Payer: Self-pay | Admitting: Physician Assistant

## 2021-12-12 DIAGNOSIS — L03119 Cellulitis of unspecified part of limb: Secondary | ICD-10-CM

## 2021-12-12 DIAGNOSIS — L02429 Furuncle of limb, unspecified: Secondary | ICD-10-CM

## 2021-12-12 MED ORDER — SULFAMETHOXAZOLE-TRIMETHOPRIM 800-160 MG PO TABS: 1.0000 | ORAL_TABLET | Freq: Two times a day (BID) | ORAL | 0 refills | Status: DC

## 2021-12-12 NOTE — Progress Notes (Signed)
E Visit for Cellulitis  We are sorry that you are not feeling well. Here is how we plan to help!  Based on what you shared with me it looks like you have cellulitis.  Cellulitis looks like areas of skin redness, swelling, and warmth; it develops as a result of bacteria entering under the skin. Little red spots and/or bleeding can be seen in skin, and tiny surface sacs containing fluid can occur. Fever can be present. Cellulitis is almost always on one side of a body, and the lower limbs are the most common site of involvement.   I have prescribed:  Bactrim DS 1 tablet by mouth twice a day for 7 days  HOME CARE:  Take your medications as ordered and take all of them, even if the skin irritation appears to be healing.   GET HELP RIGHT AWAY IF:  Symptoms that don't begin to go away within 48 hours. Severe redness persists or worsens If the area turns color, spreads or swells. If it blisters and opens, develops yellow-brown crust or bleeds. You develop a fever or chills. If the pain increases or becomes unbearable.  Are unable to keep fluids and food down.  MAKE SURE YOU   Understand these instructions. Will watch your condition. Will get help right away if you are not doing well or get worse.  Thank you for choosing an e-visit.  Your e-visit answers were reviewed by a board certified advanced clinical practitioner to complete your personal care plan. Depending upon the condition, your plan could have included both over the counter or prescription medications.  Please review your pharmacy choice. Make sure the pharmacy is open so you can pick up prescription now. If there is a problem, you may contact your provider through MyChart messaging and have the prescription routed to another pharmacy.  Your safety is important to us. If you have drug allergies check your prescription carefully.   For the next 24 hours you can use MyChart to ask questions about today's visit, request a  non-urgent call back, or ask for a work or school excuse. You will get an email in the next two days asking about your experience. I hope that your e-visit has been valuable and will speed your recovery.  I provided 5 minutes of non face-to-face time during this encounter for chart review and documentation.   

## 2021-12-22 ENCOUNTER — Encounter: Payer: Self-pay | Admitting: Internal Medicine

## 2022-01-10 ENCOUNTER — Telehealth: Payer: Self-pay | Admitting: Physician Assistant

## 2022-01-10 DIAGNOSIS — L02429 Furuncle of limb, unspecified: Secondary | ICD-10-CM

## 2022-01-10 DIAGNOSIS — L03119 Cellulitis of unspecified part of limb: Secondary | ICD-10-CM

## 2022-01-10 MED ORDER — SULFAMETHOXAZOLE-TRIMETHOPRIM 800-160 MG PO TABS: 1.0000 | ORAL_TABLET | Freq: Two times a day (BID) | ORAL | 0 refills | Status: DC

## 2022-01-10 NOTE — Progress Notes (Signed)
E Visit for Cellulitis  We are sorry that you are not feeling well. Here is how we plan to help!  Based on what you shared with me it looks like you have cellulitis.  Cellulitis looks like areas of skin redness, swelling, and warmth; it develops as a result of bacteria entering under the skin. Little red spots and/or bleeding can be seen in skin, and tiny surface sacs containing fluid can occur. Fever can be present. Cellulitis is almost always on one side of a body, and the lower limbs are the most common site of involvement.   I have prescribed:  Bactrim DS 1 tablet by mouth twice a day for 7 days  HOME CARE:  Take your medications as ordered and take all of them, even if the skin irritation appears to be healing.   GET HELP RIGHT AWAY IF:  Symptoms that don't begin to go away within 48 hours. Severe redness persists or worsens If the area turns color, spreads or swells. If it blisters and opens, develops yellow-brown crust or bleeds. You develop a fever or chills. If the pain increases or becomes unbearable.  Are unable to keep fluids and food down.  MAKE SURE YOU   Understand these instructions. Will watch your condition. Will get help right away if you are not doing well or get worse.  Thank you for choosing an e-visit.  Your e-visit answers were reviewed by a board certified advanced clinical practitioner to complete your personal care plan. Depending upon the condition, your plan could have included both over the counter or prescription medications.  Please review your pharmacy choice. Make sure the pharmacy is open so you can pick up prescription now. If there is a problem, you may contact your provider through MyChart messaging and have the prescription routed to another pharmacy.  Your safety is important to us. If you have drug allergies check your prescription carefully.   For the next 24 hours you can use MyChart to ask questions about today's visit, request a  non-urgent call back, or ask for a work or school excuse. You will get an email in the next two days asking about your experience. I hope that your e-visit has been valuable and will speed your recovery.  I provided 5 minutes of non face-to-face time during this encounter for chart review and documentation.   

## 2022-01-25 ENCOUNTER — Encounter: Payer: Self-pay | Admitting: Internal Medicine

## 2022-02-07 ENCOUNTER — Encounter: Payer: Self-pay | Admitting: Internal Medicine

## 2023-05-20 HISTORY — PX: NO PAST SURGERIES: SHX2092

## 2023-05-29 DIAGNOSIS — Z1151 Encounter for screening for human papillomavirus (HPV): Secondary | ICD-10-CM | POA: Diagnosis not present

## 2023-05-29 DIAGNOSIS — Z01419 Encounter for gynecological examination (general) (routine) without abnormal findings: Secondary | ICD-10-CM | POA: Diagnosis not present

## 2023-05-29 DIAGNOSIS — Z124 Encounter for screening for malignant neoplasm of cervix: Secondary | ICD-10-CM | POA: Diagnosis not present

## 2023-05-29 DIAGNOSIS — Z3009 Encounter for other general counseling and advice on contraception: Secondary | ICD-10-CM | POA: Diagnosis not present

## 2023-05-29 DIAGNOSIS — Z1389 Encounter for screening for other disorder: Secondary | ICD-10-CM | POA: Diagnosis not present

## 2023-06-02 LAB — HM PAP SMEAR: HPV, high-risk: NEGATIVE

## 2023-06-05 ENCOUNTER — Ambulatory Visit (INDEPENDENT_AMBULATORY_CARE_PROVIDER_SITE_OTHER): Payer: 59 | Admitting: Medical

## 2023-06-05 ENCOUNTER — Encounter: Payer: Self-pay | Admitting: Medical

## 2023-06-05 VITALS — BP 112/64 | HR 105 | Ht 66.5 in | Wt 187.4 lb

## 2023-06-05 DIAGNOSIS — Z136 Encounter for screening for cardiovascular disorders: Secondary | ICD-10-CM

## 2023-06-05 DIAGNOSIS — Z0000 Encounter for general adult medical examination without abnormal findings: Secondary | ICD-10-CM | POA: Insufficient documentation

## 2023-06-05 DIAGNOSIS — Z1389 Encounter for screening for other disorder: Secondary | ICD-10-CM

## 2023-06-05 DIAGNOSIS — N920 Excessive and frequent menstruation with regular cycle: Secondary | ICD-10-CM | POA: Diagnosis not present

## 2023-06-05 DIAGNOSIS — Z7185 Encounter for immunization safety counseling: Secondary | ICD-10-CM

## 2023-06-05 DIAGNOSIS — Z23 Encounter for immunization: Secondary | ICD-10-CM

## 2023-06-05 DIAGNOSIS — Z13220 Encounter for screening for lipoid disorders: Secondary | ICD-10-CM | POA: Diagnosis not present

## 2023-06-05 DIAGNOSIS — R5383 Other fatigue: Secondary | ICD-10-CM | POA: Diagnosis not present

## 2023-06-05 DIAGNOSIS — R000 Tachycardia, unspecified: Secondary | ICD-10-CM

## 2023-06-05 LAB — URINALYSIS, ROUTINE W REFLEX MICROSCOPIC
Bilirubin, UA: NEGATIVE
Glucose, UA: NEGATIVE
Leukocytes,UA: NEGATIVE
Nitrite, UA: NEGATIVE
RBC, UA: NEGATIVE
Specific Gravity, UA: >=1.03 — AB (ref 1.005–1.030)
Urobilinogen, Ur: 1 mg/dL (ref 0.2–1.0)
pH, UA: 6 (ref 5.0–7.5)

## 2023-06-05 NOTE — Progress Notes (Signed)
 Subjective:   HPI  Melanie Mclaughlin is a 33 y.o. female who presents for Chief Complaint  Patient presents with   Annual Exam    Fasting cpe, would like labs done today- feeling tired all the time. Has obgyn. Declines all vaccines today    Patient Care Team: Sita Mangen, Kermit Balo, PA-C as PCP - General (Family Medicine) Sherian Rein, MD as Consulting Physician (Obstetrics and Gynecology)   Concerns: Here for well visit.  Lately she has felt low energy, felt fatigued.  She has not been following exercising sometimes.  She is on birth control patch per gynecology.  She does saw them this past week, up-to-date on Pap smear.  She has been doing semaglutide injections since December through an [employer].  She has lost 4 pounds so far.  Otherwise normal state of health.  Regarding elevated pulse rate today, she denies chest pain, palpitations, edema.  reviewed their medical, surgical, family, social, medication, and allergy history and updated chart as appropriate.  No Known Allergies  Past Medical History:  Diagnosis Date   Allergy    RHINITIS   Ureterolithiasis     Current Outpatient Medications on File Prior to Visit  Medication Sig Dispense Refill   norelgestromin-ethinyl estradiol [facility]) 150-35 MCG/24HR transdermal patch Place 1 patch onto the skin once a week.     No current facility-administered medications on file prior to visit.      Current Outpatient Medications:    norelgestromin-ethinyl estradiol [employer]) 150-35 MCG/24HR transdermal patch, Place 1 patch onto the skin once a week., Disp: , Rfl:   Family History  Problem Relation Age of Onset   Cancer Father        leukemia   Diabetes type II Father    Asthma Sister    Cancer Maternal Aunt    Cancer Maternal Grandmother    Hypertension Maternal Grandmother    Diabetes Maternal Grandmother    Heart disease Other     Past Surgical History:  Procedure Laterality Date   NO PAST  SURGERIES  05/2023     Review of Systems  Constitutional:  Positive for malaise/fatigue. Negative for chills, fever and weight loss.  HENT:  Negative for congestion, ear pain, hearing loss, sore throat and tinnitus.   Eyes:  Negative for blurred vision, pain and redness.  Respiratory:  Negative for cough, hemoptysis and shortness of breath.   Cardiovascular:  Negative for chest pain, palpitations, orthopnea, claudication and leg swelling.  Gastrointestinal:  Negative for abdominal pain, blood in stool, constipation, diarrhea, nausea and vomiting.  Genitourinary:  Negative for dysuria, flank pain, frequency, hematuria and urgency.  Musculoskeletal:  Negative for falls, joint pain and myalgias.  Skin:  Negative for itching and rash.  Neurological:  Negative for dizziness, tingling, speech change, weakness and headaches.  Endo/Heme/Allergies:  Negative for polydipsia. Does not bruise/bleed easily.  Psychiatric/Behavioral:  Negative for depression and memory loss. The patient is not nervous/anxious and does not have insomnia.         Objective:  BP 112/64   Pulse (!) 105   Ht 5' 6.5" (1.689 m)   Wt 187 lb 6.4 oz (85 kg)   SpO2 98%   BMI 29.79 kg/m   General appearance: alert, no distress, WD/WN, African American female Skin: unremarkable HEENT: normocephalic, conjunctiva/corneas normal, sclerae anicteric, PERRLA, EOMi, nares patent, no discharge or erythema, pharynx normal Oral cavity: MMM, tongue normal, teeth normal Neck: supple, no lymphadenopathy, no thyromegaly, no masses, normal ROM, no bruits  Chest: non tender, normal shape and expansion Heart: RRR, normal S1, S2, no murmurs Lungs: CTA bilaterally, no wheezes, rhonchi, or rales Abdomen: +bs, soft, non tender, non distended, no masses, no hepatomegaly, no splenomegaly, no bruits Back: non tender, normal ROM, no scoliosis Musculoskeletal: upper extremities non tender, no obvious deformity, normal ROM throughout, lower  extremities non tender, no obvious deformity, normal ROM throughout Extremities: no edema, no cyanosis, no clubbing Pulses: 2+ symmetric, upper and lower extremities, normal cap refill Neurological: alert, oriented x 3, CN2-12 intact, strength normal upper extremities and lower extremities, sensation normal throughout, DTRs 2+ throughout, no cerebellar signs, gait normal Psychiatric: normal affect, behavior normal, pleasant  Breast/gyn/rectal - deferred to gynecology   EKG reviewed    Assessment and Plan :   Encounter Diagnoses  Name Primary?   Encounter for health maintenance examination in adult Yes   Need for Tdap vaccination    Encounter for lipid screening for cardiovascular disease    Vaccine counseling    Screening for hematuria or proteinuria    Fatigue, unspecified type    Menorrhagia with regular cycle    Tachycardia      This visit was a preventative care visit, also known as wellness visit or routine physical.   Topics typically include healthy lifestyle, diet, exercise, preventative care, vaccinations, sick and well care, proper use of emergency dept and after hours care, as well as other concerns.    Separate significant issues discussed: Fatigue-we discussed possible causes.  She does have heavy periods, could be anemic.  Her heart rate is resting over 100.  EKG reviewed.  Discussed other potential cardiac sources or thyroid issues.  Labs as below today.  Heavy periods-labs today as below  Tachycardia-discussed the findings, labs as below and then follow-up pending labs   General Recommendations: Continue to return yearly for your annual wellness and preventative care visits.  This gives [place] a chance to discuss healthy lifestyle, exercise, vaccinations, review your chart record, and perform screenings where appropriate.  I recommend you see your eye doctor yearly for routine vision care.  I recommend you see your dentist yearly for routine dental care including  hygiene visits twice yearly.   Vaccination recommendations were reviewed Immunization History  Administered Date(s) Administered   DTaP 05/02/1991, 07/02/1991, 09/17/1991, 06/25/1992   IPV 03/17/1995   Influenza,inj,Quad PF,6+ Mos 05/18/2016   MMR 06/25/1992, 11/18/2003   PFIZER(Purple Top)SARS-COV-2 Vaccination 06/21/2019, 07/12/2019   PPD Test 11/11/2009, 09/18/2015, 05/19/2021   Tdap 11/09/2009    Counseled on the Tdap (tetanus, diptheria, and acellular pertussis) vaccine.  Vaccine information sheet given. Tdap vaccine given after consent obtained.  Flu shot declined   Screening for cancer: Colon cancer screening: Age 21  Breast cancer screening: You should perform a self breast exam monthly.    Cervical cancer screening: We reviewed recommendations for pap smear screening. Last pap-we will request from gynecology from last week's visit   Skin cancer screening: Check your skin regularly for new changes, growing lesions, or other lesions of concern Come in for evaluation if you have skin lesions of concern.  Lung cancer screening: If you have a greater than 20 pack year history of tobacco use, then you may qualify for lung cancer screening with a chest CT scan.   Please call your insurance company to inquire about coverage for this test.  Pancreatic cancer: no current screening test is available routinely recommended.  (Risk factors: Smoking, overweight or obese, diabetes, chronic pancreatitis, work Nurse, mental health, Solicitor, 35 year old or  greater, female greater than female, African-American, family history of pancreatic cancer, hereditary breast, ovarian, melanoma, Lynch, Peutz-jeghers).  We currently don't have screenings for other cancers besides breast, cervical, colon, and lung cancers.  If you have a strong family history of cancer or have other cancer screening concerns, please let me know.    Bone health: Get at least 150 minutes of aerobic exercise  weekly Get weight bearing exercise at least once weekly Bone density test:  A bone density test is an imaging test that uses a type of X-ray to measure the amount of calcium and other minerals in your bones. The test may be used to diagnose or screen you for a condition that causes weak or thin bones (osteoporosis), predict your risk for a broken bone (fracture), or determine how well your osteoporosis treatment is working. The bone density test is recommended for females 65 and older, or females or males <65 if certain risk factors such as thyroid disease, long term use of steroids such as for asthma or rheumatological issues, vitamin D deficiency, estrogen deficiency, family history of osteoporosis, self or family history of fragility fracture in first degree relative.    Heart health: Get at least 150 minutes of aerobic exercise weekly Limit alcohol It is important to maintain a healthy blood pressure and healthy cholesterol numbers  Heart disease screening: Screening for heart disease includes screening for blood pressure, fasting lipids, glucose/diabetes screening, BMI height to weight ratio, reviewed of smoking status, physical activity, and diet.    Goals include blood pressure 120/80 or less, maintaining a healthy lipid/cholesterol profile, preventing diabetes or keeping diabetes numbers under good control, not smoking or using tobacco products, exercising most days per week or at least 150 minutes per week of exercise, and eating healthy variety of fruits and vegetables, healthy oils, and avoiding unhealthy food choices like fried food, fast food, high sugar and high cholesterol foods.      Vascular disease screening: For high risk individuals including smokers, diabetes, patients with known heart disease or high blood pressure, kidney disease, and others, screening for vascular disease or atherosclerosis of the arteries is available.  Examples may include carotid ultrasound, abdominal  aortic ultrasound, ABI blood flow screening in the legs, thoracic aorta screening.    Medical care options: I recommend you continue to seek care here first for routine care.  We try really hard to have available appointments Monday through Friday daytime hours for sick visits, acute visits, and physicals.  Urgent care should be used for after hours and weekends for significant issues that cannot wait till the next day.  The emergency department should be used for significant potentially life-threatening emergencies.  The emergency department is expensive, can often have long wait times for less significant concerns, so try to utilize primary care, urgent care, or telemedicine when possible to avoid unnecessary trips to the emergency department.  Virtual visits and telemedicine have been introduced since the pandemic started in 2020, and can be convenient ways to receive medical care.  We offer virtual appointments as well to assist you in a variety of options to seek medical care.   Legal Take the time to do a Last Will and Testament, advanced directives including Healthcare Power of Attorney and Living Will documents.  Do not leave your family with burdens that can be handled ahead of time.   Spiritual and Emotional Health Keeping a healthy spiritual life can help you better manage your physical health. Your spiritual life can help  you to cope with any issues that may arise with your physical health.  Balance can keep [place] healthy and help [place] to recover.  If you are struggling with your spiritual health there are questions that you may want to ask yourself:  What makes me feel most complete? When do I feel most connected to the rest of the world? Where do I find the most inner strength? What am I doing when I feel whole?  Helpful tips: Being in nature. Some people feel very connected and at peace when they are walking outdoors or are outside. Helping others. Some feel the largest sense of  wellbeing when they are of service to others. Being of service can take on many forms. It can be doing volunteer work, being kind to strangers, or offering a hand to a friend in need. Gratitude. Some people find they feel the most connected when they remain grateful. They may make lists of all the things they are grateful for or say a thank you out loud for all they have.    Emotional Health Are you in tune with your emotional health?  Check out this link: http://www.marquez-love.com/   Financial Health Make sure you use a budget for your personal finances Make sure you are insured against risks (health insurance, life insurance, auto insurance, etc) Save more, spend less Set financial goals If you need help in this area, good resources include counseling through [employer] or other community resources, have a meeting with a Social research officer, government, and a good resource is Scientist, physiological    [place] was seen today for annual exam.  Diagnoses and all orders for this visit:  Encounter for health maintenance examination in adult -     Comprehensive metabolic panel -     CBC with Differential/Platelet -     TSH -     Lipid panel -     Vitamin B12 -     Hemoglobin A1c -     Urinalysis, Routine w reflex microscopic -     Vitamin D, 25-hydroxy -     Iron -     T4, free  Need for Tdap vaccination  Encounter for lipid screening for cardiovascular disease -     Lipid panel  Vaccine counseling  Screening for hematuria or proteinuria -     Urinalysis, Routine w reflex microscopic  Fatigue, unspecified type -     CBC with Differential/Platelet -     TSH -     Vitamin D, 25-hydroxy -     Iron -     T4, free  Menorrhagia with regular cycle -     TSH -     Iron  Tachycardia -     T4, free     Follow-up pending labs, yearly for physical

## 2023-06-06 ENCOUNTER — Other Ambulatory Visit: Payer: Self-pay | Admitting: Medical

## 2023-06-06 LAB — HEMOGLOBIN A1C
Est. average glucose Bld gHb Est-mCnc: 108 mg/dL
Hgb A1c MFr Bld: 5.4 % (ref 4.8–5.6)

## 2023-06-06 LAB — COMPREHENSIVE METABOLIC PANEL
ALT: 9 [IU]/L (ref 0–32)
AST: 19 [IU]/L (ref 0–40)
Albumin: 4.6 g/dL (ref 3.9–4.9)
Alkaline Phosphatase: 49 [IU]/L (ref 44–121)
BUN/Creatinine Ratio: 9 (ref 9–23)
BUN: 8 mg/dL (ref 6–20)
Bilirubin Total: 0.2 mg/dL (ref 0.0–1.2)
CO2: 18 mmol/L — ABNORMAL LOW (ref 20–29)
Calcium: 9.3 mg/dL (ref 8.7–10.2)
Chloride: 103 mmol/L (ref 96–106)
Creatinine, Ser: 0.89 mg/dL (ref 0.57–1.00)
Globulin, Total: 2.7 g/dL (ref 1.5–4.5)
Glucose: 81 mg/dL (ref 70–99)
Potassium: 4.8 mmol/L (ref 3.5–5.2)
Sodium: 135 mmol/L (ref 134–144)
Total Protein: 7.3 g/dL (ref 6.0–8.5)
eGFR: 88 mL/min/{1.73_m2} (ref >59)

## 2023-06-06 LAB — CBC WITH DIFFERENTIAL/PLATELET
Basophils Absolute: 0 10*3/uL (ref 0.0–0.2)
Basos: 0 %
EOS (ABSOLUTE): 0 10*3/uL (ref 0.0–0.4)
Eos: 0 %
Hematocrit: 35.6 % (ref 34.0–46.6)
Hemoglobin: 11.8 g/dL (ref 11.1–15.9)
Immature Grans (Abs): 0 10*3/uL (ref 0.0–0.1)
Immature Granulocytes: 0 %
Lymphocytes Absolute: 1.7 10*3/uL (ref 0.7–3.1)
Lymphs: 30 %
MCH: 28.6 pg (ref 26.6–33.0)
MCHC: 33.1 g/dL (ref 31.5–35.7)
MCV: 86 fL (ref 79–97)
Monocytes Absolute: 0.4 10*3/uL (ref 0.1–0.9)
Monocytes: 7 %
Neutrophils Absolute: 3.5 10*3/uL (ref 1.4–7.0)
Neutrophils: 63 %
Platelets: 304 10*3/uL (ref 150–450)
RBC: 4.12 x10E6/uL (ref 3.77–5.28)
RDW: 13.1 % (ref 11.7–15.4)
WBC: 5.6 10*3/uL (ref 3.4–10.8)

## 2023-06-06 LAB — VITAMIN D 25 HYDROXY (VIT D DEFICIENCY, FRACTURES): Vit D, 25-Hydroxy: 11.1 ng/mL — ABNORMAL LOW (ref 30.0–100.0)

## 2023-06-06 LAB — VITAMIN B12: Vitamin B-12: 356 pg/mL (ref 232–1245)

## 2023-06-06 LAB — LIPID PANEL
Chol/HDL Ratio: 2.5 {ratio} (ref 0.0–4.4)
Cholesterol, Total: 137 mg/dL (ref 100–199)
HDL: 55 mg/dL (ref >39)
LDL Chol Calc (NIH): 70 mg/dL (ref 0–99)
Triglycerides: 53 mg/dL (ref 0–149)
VLDL Cholesterol Cal: 12 mg/dL (ref 5–40)

## 2023-06-06 LAB — TSH: TSH: 1.89 u[IU]/mL (ref 0.450–4.500)

## 2023-06-06 LAB — T4, FREE: Free T4: 1.12 ng/dL (ref 0.82–1.77)

## 2023-06-06 LAB — IRON: Iron: 37 ug/dL (ref 27–159)

## 2023-06-06 MED ORDER — PRENATAL VITAMINS 28-0.8 MG PO TABS
1.0000 | ORAL_TABLET | Freq: Every day | ORAL | 1 refills | Status: DC
Start: 2023-06-06 — End: 2023-08-17

## 2023-06-06 MED ORDER — VITAMIN D (ERGOCALCIFEROL) 1.25 MG (50000 UNIT) PO CAPS: 50000.0000 [IU] | ORAL_CAPSULE | ORAL | 1 refills | Status: DC

## 2023-06-06 NOTE — Progress Notes (Signed)
 Results sent through MyChart

## 2023-06-13 ENCOUNTER — Encounter (HOSPITAL_BASED_OUTPATIENT_CLINIC_OR_DEPARTMENT_OTHER): Payer: Self-pay | Admitting: Emergency Medicine

## 2023-06-13 ENCOUNTER — Emergency Department (HOSPITAL_BASED_OUTPATIENT_CLINIC_OR_DEPARTMENT_OTHER)
Admission: EM | Admit: 2023-06-13 | Discharge: 2023-06-13 | Disposition: A | Discharge status: Home / Self Care | Payer: 59 | Attending: Emergency Medicine | Admitting: Emergency Medicine

## 2023-06-13 ENCOUNTER — Other Ambulatory Visit: Payer: Self-pay

## 2023-06-13 DIAGNOSIS — J069 Acute upper respiratory infection, unspecified: Secondary | ICD-10-CM | POA: Diagnosis not present

## 2023-06-13 DIAGNOSIS — R059 Cough, unspecified: Secondary | ICD-10-CM | POA: Diagnosis not present

## 2023-06-13 DIAGNOSIS — R519 Headache, unspecified: Secondary | ICD-10-CM | POA: Diagnosis not present

## 2023-06-13 DIAGNOSIS — M791 Myalgia, unspecified site: Secondary | ICD-10-CM | POA: Insufficient documentation

## 2023-06-13 DIAGNOSIS — B9789 Other viral agents as the cause of diseases classified elsewhere: Secondary | ICD-10-CM | POA: Diagnosis not present

## 2023-06-13 LAB — RESP PANEL BY RT-PCR (RSV, FLU A&B, COVID)  RVPGX2
Influenza A by PCR: NEGATIVE
Influenza B by PCR: NEGATIVE
Resp Syncytial Virus by PCR: NEGATIVE
SARS Coronavirus 2 by RT PCR: NEGATIVE

## 2023-06-13 NOTE — ED Provider Notes (Signed)
 [place] [facility] Provider Note   CSN: [id number] Arrival date & time: 06/13/23  1127     History  Chief Complaint  Patient presents with   Shortness of Breath    Melanie Mclaughlin is a 33 y.o. female.  With a history of seasonal allergies, ADHD presenting to the ED for evaluation of flulike symptoms.  She reports cough, headaches, body aches.  Symptoms began 3 days ago.  She has 2 family members in the house that were positive for influenza A within the past 2 days.  Cough is nonproductive.  She states that she was talking while at work today and felt short of breath which concerned her.  She denies any fevers or chills.  No chest pain.  No recent cancer treatments, surgeries, long distance travel, unilateral leg swelling, history of DVT or PE.   Shortness of Breath Associated symptoms: cough        Home Medications Prior to Admission medications   Medication Sig Start Date End Date Taking? Authorizing Provider  norelgestromin-ethinyl estradiol Burr Medico) 150-35 MCG/24HR transdermal patch Place 1 patch onto the skin once a week. 05/29/23   [provider]  Prenatal Vit-Fe Fumarate-FA (PRENATAL VITAMINS) 28-0.8 MG TABS Take 1 tablet by mouth daily. 06/06/23   Tysinger, Kermit Balo, PA-C  Vitamin D, Ergocalciferol, (DRISDOL) 1.25 MG (50000 UNIT) CAPS capsule Take 1 capsule (50,000 Units total) by mouth every 7 (seven) days. 06/06/23   Tysinger, Kermit Balo, PA-C      Allergies    Patient has no known allergies.    Review of Systems   Review of Systems  Respiratory:  Positive for cough and shortness of breath.   Musculoskeletal:  Positive for myalgias.  All other systems reviewed and are negative.   Physical Exam Updated Vital Signs BP (!) 131/95 (BP Location: Left Arm)   Pulse (!) 104   Temp 98.1 F (36.7 C) (Oral)   Resp 18   Wt 85 kg   LMP 05/16/2023 (Exact Date)   SpO2 100%   BMI 29.79 kg/m  Physical Exam Vitals and nursing  note reviewed.  Constitutional:      General: She is not in acute distress.    Appearance: Normal appearance. She is normal weight. She is not ill-appearing.  HENT:     Head: Normocephalic and atraumatic.  Cardiovascular:     Rate and Rhythm: Normal rate and regular rhythm.  Pulmonary:     Effort: Pulmonary effort is normal. No respiratory distress.     Breath sounds: No decreased breath sounds, wheezing, rhonchi or rales.     Comments: Speaking in full sentences, nonlabored breathing Abdominal:     General: Abdomen is flat.  Musculoskeletal:        General: Normal range of motion.     Cervical back: Neck supple.     Right lower leg: No edema.     Left lower leg: No edema.  Skin:    General: Skin is warm and dry.  Neurological:     Mental Status: She is alert and oriented to person, place, and time.  Psychiatric:        Mood and Affect: Mood normal.        Behavior: Behavior normal.     ED Results / Procedures / Treatments   Labs (all labs ordered are listed, but only abnormal results are displayed) Labs Reviewed  RESP PANEL BY RT-PCR (RSV, FLU A&B, COVID)  RVPGX2  EKG None  Radiology No results found.  Procedures Procedures    Medications Ordered in ED Medications - No data to display  ED Course/ Medical Decision Making/ A&P                                 Medical Decision Making This patient presents to the ED for concern of flulike symptoms, this involves an extensive number of treatment options, and is a complaint that carries with it a high risk of complications and morbidity.  The differential diagnosis includes flu, COVID, RSV, other viral URI.  Less likely, PE, ACS, pneumonia  My initial workup includes respiratory panel  Additional history obtained from: Nursing notes from this visit. Mother at bedside  I ordered, reviewed and interpreted labs which include: Respiratory panel negative  Afebrile, hemodynamically stable.  33 year old female  presenting to the ED for evaluation of flulike symptoms.  Symptoms began approximately 3 days ago.  Multiple laboratory confirmed exposures to influenza A.  Respiratory panel negative.  Suspect viral etiology.  She was educated on supportive care and typical timeline of symptoms.  Other emergent cardiopulmonary etiologies such as ACS, PE, pneumonia was considered, however significantly less likely given constellation of symptoms at presentation.  She was given return cautions.  Stable at discharge.  At this time there does not appear to be any evidence of an acute emergency medical condition and the patient appears stable for discharge with appropriate outpatient follow up. Diagnosis was discussed with patient who verbalizes understanding of care plan and is agreeable to discharge. I have discussed return precautions with patient and mother who verbalizes understanding. Patient encouraged to follow-up with their PCP within 1 week. All questions answered.  Note: Portions of this report may have been transcribed using voice recognition software. Every effort was made to ensure accuracy; however, inadvertent computerized transcription errors may still be present.        Final Clinical Impression(s) / ED Diagnoses Final diagnoses:  Viral URI    Rx / DC Orders ED Discharge Orders     None         Michelle Piper, Cordelia Poche 06/13/23 1356    Jacalyn Lefevre, MD 06/13/23 1529

## 2023-06-13 NOTE — ED Triage Notes (Signed)
 Flu like symptoms x 2 days she said , started having shortness of breath with talking at work . No obvious resp distress in triage .  Reports  cough and headache  Denies chest pain .

## 2023-06-13 NOTE — Discharge Instructions (Signed)
 You have been seen today for your complaint of flulike symptoms. Your lab work was negative for flu, COVID, RSV today.  It is still likely that you have a different viral illness. Your discharge medications include Alternate tylenol and ibuprofen for pain and fevers. You may alternate these every 4 hours. You may take up to 800 mg of ibuprofen at a time and up to 1000 mg of tylenol. Use Claritin and Flonase for congestion and runny nose. Use Robitussin DM, Mucinex DM or Delsym for your cough. Home care instructions are as follows:  Drink plenty of water.  Eat frequent small meals if you lose your appetite Follow up with: Your primary care provider Please seek immediate medical care if you develop any of the following symptoms: You feel pain or pressure in your chest. You have trouble breathing. You faint or feel like you will faint. You keep vomiting and it gets worse. You feel confused. At this time there does not appear to be the presence of an emergent medical condition, however there is always the potential for conditions to change. Please read and follow the below instructions.  Do not take your medicine if  develop an itchy rash, swelling in your mouth or lips, or difficulty breathing; call 911 and seek immediate emergency medical attention if this occurs.  You may review your lab tests and imaging results in their entirety on your MyChart account.  Please discuss all results of fully with your primary care provider and other specialist at your follow-up visit.  Note: Portions of this text may have been transcribed using voice recognition software. Every effort was made to ensure accuracy; however, inadvertent computerized transcription errors may still be present.

## 2023-08-15 ENCOUNTER — Emergency Department (HOSPITAL_BASED_OUTPATIENT_CLINIC_OR_DEPARTMENT_OTHER)

## 2023-08-15 ENCOUNTER — Other Ambulatory Visit: Payer: Self-pay

## 2023-08-15 ENCOUNTER — Encounter (HOSPITAL_BASED_OUTPATIENT_CLINIC_OR_DEPARTMENT_OTHER): Payer: Self-pay

## 2023-08-15 ENCOUNTER — Emergency Department (HOSPITAL_BASED_OUTPATIENT_CLINIC_OR_DEPARTMENT_OTHER)
Admission: EM | Admit: 2023-08-15 | Discharge: 2023-08-15 | Disposition: A | Discharge status: Home / Self Care | Attending: Emergency Medicine | Admitting: Emergency Medicine

## 2023-08-15 ENCOUNTER — Ambulatory Visit: Payer: Self-pay

## 2023-08-15 DIAGNOSIS — R000 Tachycardia, unspecified: Secondary | ICD-10-CM | POA: Insufficient documentation

## 2023-08-15 DIAGNOSIS — R0602 Shortness of breath: Secondary | ICD-10-CM | POA: Diagnosis not present

## 2023-08-15 DIAGNOSIS — J189 Pneumonia, unspecified organism: Secondary | ICD-10-CM | POA: Diagnosis not present

## 2023-08-15 DIAGNOSIS — J984 Other disorders of lung: Secondary | ICD-10-CM | POA: Diagnosis not present

## 2023-08-15 DIAGNOSIS — R918 Other nonspecific abnormal finding of lung field: Secondary | ICD-10-CM | POA: Diagnosis not present

## 2023-08-15 LAB — BASIC METABOLIC PANEL WITH GFR
Anion gap: 14 (ref 5–15)
BUN: 8 mg/dL (ref 6–20)
CO2: 20 mmol/L — ABNORMAL LOW (ref 22–32)
Calcium: 9.6 mg/dL (ref 8.9–10.3)
Chloride: 102 mmol/L (ref 98–111)
Creatinine, Ser: 0.92 mg/dL (ref 0.44–1.00)
GFR, Estimated: >60 mL/min (ref >60)
Glucose, Bld: 105 mg/dL — ABNORMAL HIGH (ref 70–99)
Potassium: 4.3 mmol/L (ref 3.5–5.1)
Sodium: 136 mmol/L (ref 135–145)

## 2023-08-15 LAB — CBC
HCT: 38.8 % (ref 36.0–46.0)
Hemoglobin: 13 g/dL (ref 12.0–15.0)
MCH: 28.8 pg (ref 26.0–34.0)
MCHC: 33.5 g/dL (ref 30.0–36.0)
MCV: 85.8 fL (ref 80.0–100.0)
Platelets: 340 10*3/uL (ref 150–400)
RBC: 4.52 MIL/uL (ref 3.87–5.11)
RDW: 13 % (ref 11.5–15.5)
WBC: 9.2 10*3/uL (ref 4.0–10.5)
nRBC: 0 % (ref 0.0–0.2)

## 2023-08-15 LAB — D-DIMER, QUANTITATIVE: D-Dimer, Quant: 0.78 ug{FEU}/mL — ABNORMAL HIGH (ref 0.00–0.50)

## 2023-08-15 LAB — RESP PANEL BY RT-PCR (RSV, FLU A&B, COVID)  RVPGX2
Influenza A by PCR: NEGATIVE
Influenza B by PCR: NEGATIVE
Resp Syncytial Virus by PCR: NEGATIVE
SARS Coronavirus 2 by RT PCR: NEGATIVE

## 2023-08-15 LAB — PRO BRAIN NATRIURETIC PEPTIDE: Pro Brain Natriuretic Peptide: <36 pg/mL (ref <300.0)

## 2023-08-15 LAB — TROPONIN T, HIGH SENSITIVITY: Troponin T High Sensitivity: <15 ng/L (ref <19)

## 2023-08-15 MED ORDER — DOXYCYCLINE HYCLATE 100 MG PO TABS
100.0000 mg | ORAL_TABLET | Freq: Once | ORAL | Status: AC
  Administered 2023-08-15: 100 mg via ORAL
  Filled 2023-08-15: qty 1

## 2023-08-15 MED ORDER — ALBUTEROL SULFATE HFA 108 (90 BASE) MCG/ACT IN AERS
2.0000 | INHALATION_SPRAY | RESPIRATORY_TRACT | Status: DC | PRN
Start: 2023-08-15 — End: 2023-08-15

## 2023-08-15 MED ORDER — FLUCONAZOLE 200 MG PO TABS: 200.0000 mg | ORAL_TABLET | Freq: Every day | ORAL | 0 refills | Status: DC | PRN

## 2023-08-15 MED ORDER — SODIUM CHLORIDE 0.9 % IV SOLN
1.0000 g | Freq: Once | INTRAVENOUS | Status: AC
  Administered 2023-08-15: 1 g via INTRAVENOUS
  Filled 2023-08-15: qty 10

## 2023-08-15 MED ORDER — IOHEXOL 350 MG/ML SOLN
100.0000 mL | Freq: Once | INTRAVENOUS | Status: AC | PRN
  Administered 2023-08-15: 100 mL via INTRAVENOUS

## 2023-08-15 MED ORDER — DOXYCYCLINE HYCLATE 100 MG PO CAPS
100.0000 mg | ORAL_CAPSULE | Freq: Two times a day (BID) | ORAL | 0 refills | Status: AC
Start: 2023-08-15 — End: 2023-08-22

## 2023-08-15 NOTE — Discharge Instructions (Signed)
 You are being treated for possible lung infection seen on your CT scan.  You were given antibiotics in the ER and prescribed 7 more days of oral antibiotics to take at home.  Please follow-up with your primary care clinic in 2 days for recheck of your symptoms.  If you continue having chest pain, difficulty breathing, or persistent coughing after finishing your antibiotics, you may need evaluation by a specialist and can make an appointment with a pulmonologist.  You can discuss this with your doctor when you see them in 2 days.  If you have worsening difficulty breathing, consistently high heart rate above 140 beats per minute at rest, lightheadedness, feeling passing out, or chest pain, please return to the emergency department.

## 2023-08-15 NOTE — ED Provider Notes (Signed)
 [place] [facility] Provider Note   CSN: [id number] Arrival date & time: 08/15/23  0908     History  Chief Complaint  Patient presents with   Tachycardia   Shortness of Breath    Melanie Mclaughlin is a 33 y.o. female presented to the ED with shortness of breath and tachycardia.  Patient reports that she had a flulike syndrome about 1 month ago, from when she recovered.  But she has had some mild progressive shortness of breath since then.  She said for the past several nights she has noted that her heart is racing, particularly at night.  She says her typical resting heart rate is elevated about 110 bpm at rest according to her Apple Watch, but the past few nights it has been 140-150 bpm.  She was on Xulane transdermal patch (estradiol) until about 3 weeks ago, taken off this.  She denies personal or family history of DVT or PE.  She denies any calf pain or swelling or recent large surgeries.    HPI     Home Medications Prior to Admission medications   Medication Sig Start Date End Date Taking? Authorizing Provider  doxycycline (VIBRAMYCIN) 100 MG capsule Take 1 capsule (100 mg total) by mouth 2 (two) times daily for 7 days. 08/15/23 08/22/23 Yes Rick Warnick, Janalyn Me, MD  fluconazole (DIFLUCAN) 200 MG tablet Take 1 tablet (200 mg total) by mouth daily as needed for up to 1 dose. 08/15/23  Yes Arvilla Birmingham, MD  norelgestromin-ethinyl estradiol (XULANE) 150-35 MCG/24HR transdermal patch Place 1 patch onto the skin once a week. 05/29/23   [provider]  Prenatal Vit-Fe Fumarate-FA (PRENATAL VITAMINS) 28-0.8 MG TABS Take 1 tablet by mouth daily. 06/06/23   Tysinger, Christiane Cowing, PA-C  Vitamin D , Ergocalciferol , (DRISDOL ) 1.25 MG (50000 UNIT) CAPS capsule Take 1 capsule (50,000 Units total) by mouth every 7 (seven) days. 06/06/23   Tysinger, Christiane Cowing, PA-C      Allergies    Patient has no known allergies.    Review of Systems   Review of  Systems  Physical Exam Updated Vital Signs BP 118/82   Pulse (!) 107   Temp 98.3 F (36.8 C) (Oral)   Resp 15   Ht 5' 6.5" (1.689 m)   Wt 90.7 kg   LMP 07/18/2023   SpO2 100%   BMI 31.80 kg/m  Physical Exam Constitutional:      General: She is not in acute distress. HENT:     Head: Normocephalic and atraumatic.  Eyes:     Conjunctiva/sclera: Conjunctivae normal.     Pupils: Pupils are equal, round, and reactive to light.  Cardiovascular:     Rate and Rhythm: Regular rhythm. Tachycardia present.  Pulmonary:     Effort: Pulmonary effort is normal. No respiratory distress.  Abdominal:     General: There is no distension.     Tenderness: There is no abdominal tenderness.  Musculoskeletal:     Right lower leg: No tenderness. No edema.     Left lower leg: No tenderness. No edema.  Skin:    General: Skin is warm and dry.  Neurological:     General: No focal deficit present.     Mental Status: She is alert. Mental status is at baseline.  Psychiatric:        Mood and Affect: Mood normal.        Behavior: Behavior normal.     ED Results / Procedures /  Treatments   Labs (all labs ordered are listed, but only abnormal results are displayed) Labs Reviewed  BASIC METABOLIC PANEL WITH GFR - Abnormal; Notable for the following components:      Result Value   CO2 20 (*)    Glucose, Bld 105 (*)    All other components within normal limits  D-DIMER, QUANTITATIVE - Abnormal; Notable for the following components:   D-Dimer, Quant 0.78 (*)    All other components within normal limits  RESP PANEL BY RT-PCR (RSV, FLU A&B, COVID)  RVPGX2  CBC  PRO BRAIN NATRIURETIC PEPTIDE  TROPONIN T, HIGH SENSITIVITY    EKG EKG Interpretation Date/Time:  Tuesday August 15 2023 09:18:40 EDT Ventricular Rate:  129 PR Interval:  148 QRS Duration:  60 QT Interval:  275 QTC Calculation: 403 R Axis:   89  Text Interpretation: Sinus tachycardia Borderline T abnormalities, diffuse leads  Confirmed by Jerald Molly [phone]) on 08/15/2023 9:29:56 AM  Radiology CT Angio Chest PE W and/or Wo Contrast Result Date: 08/15/2023 CLINICAL DATA:  Tachycardia for 4 days.  Shortness of breath. EXAM: CT ANGIOGRAPHY CHEST WITH CONTRAST TECHNIQUE: Multidetector CT imaging of the chest was performed using the standard protocol during bolus administration of intravenous contrast. Multiplanar CT image reconstructions and MIPs were obtained to evaluate the vascular anatomy. RADIATION DOSE REDUCTION: This exam was performed according to the departmental dose-optimization program which includes automated exposure control, adjustment of the mA and/or kV according to patient size and/or use of iterative reconstruction technique. CONTRAST:  100mL OMNIPAQUE IOHEXOL 350 MG/ML SOLN COMPARISON:  None Available. FINDINGS: Cardiovascular: The heart size is normal. No substantial pericardial effusion. No thoracic aortic aneurysm. No substantial atherosclerosis of the thoracic aorta. There is no filling defect within the opacified pulmonary arteries to suggest the presence of an acute pulmonary embolus. Mediastinum/Nodes: No mediastinal lymphadenopathy. There is no hilar lymphadenopathy. The esophagus has normal imaging features. There is no axillary lymphadenopathy. Lungs/Pleura: Patchy areas of clustered micro nodularity are seen in the lung apices, right greater than left, and in the posterior lower lobes bilaterally showing tree-in-bud configuration in some areas. No dense focal airspace consolidation compressive atelectasis noted bilaterally. No pleural effusion. Upper Abdomen: Visualized portion of the upper abdomen shows no acute findings. Musculoskeletal: No worrisome lytic or sclerotic osseous abnormality. Review of the MIP images confirms the above findings. IMPRESSION: 1. No CT evidence for acute pulmonary embolus. 2. Patchy areas of clustered micro nodularity in the lung apices, right greater than left, and in the  posterior lower lobes bilaterally showing tree-in-bud configuration in some areas. Imaging features are compatible with an infectious/inflammatory etiology with atypical etiology a distinct consideration. Electronically Signed   By: Donnal Fusi M.D.   On: 08/15/2023 11:16   DG Chest 2 View Result Date: 08/15/2023 CLINICAL DATA:  Shortness of breath. EXAM: CHEST - 2 VIEW COMPARISON:  Chest radiograph dated 11/10/2007 FINDINGS: Bilateral upper lobe nodular densities concerning for pneumonia. The no focal consolidation, pleural effusion, or pneumothorax. The cardiac silhouette is within normal limits. No acute osseous pathology. IMPRESSION: Bilateral upper lobe nodular densities concerning for pneumonia. Follow-up chest radiograph in 4-6 weeks is recommended to ensure resolution. Electronically Signed   By: Angus Bark M.D.   On: 08/15/2023 10:18    Procedures Procedures    Medications Ordered in ED Medications  albuterol (VENTOLIN HFA) 108 (90 Base) MCG/ACT inhaler 2 puff (has no administration in time range)  iohexol (OMNIPAQUE) 350 MG/ML injection 100 mL (100 mLs Intravenous Contrast  Given 08/15/23 1019)  cefTRIAXone (ROCEPHIN) 1 g in sodium chloride  0.9 % 100 mL IVPB (0 g Intravenous Stopped 08/15/23 1207)  doxycycline (VIBRA-TABS) tablet 100 mg (100 mg Oral Given 08/15/23 1128)    ED Course/ Medical Decision Making/ A&P Clinical Course as of 08/15/23 1358  Tue Aug 15, 2023  1139 Patient's heart rate is improved without any significant intervention back to her baseline levels of mild tachycardia.  I reviewed the CT imaging with her and her mother.  There is an atypical infection pattern or inflammatory pattern in the right upper lung.  I think it is reasonable to treat with antibiotics and will do Rocephin and doxycycline.  He has a follow-up appointment in 2 days with her PCP in the clinic which I think would be a good time to reassess her vital signs and symptoms.  I do not think she is  needing a beta-blocker at this time as she does have resting tachycardia at baseline.  I did advise if she continues to have persistent symptoms of shortness of breath or is failing to improve with the antibiotics, she may benefit from pulmonology evaluation with the atypical tree-in-bud opacity presentation.  She and her mother verbalized understanding.  Stable for discharge [MT]    Clinical Course User Index [MT] Latanza Pfefferkorn, Janalyn Me, MD                                 Medical Decision Making Amount and/or Complexity of Data Reviewed Labs: ordered. Radiology: ordered.  Risk Prescription drug management.   This patient presents to the ED with concern for tachycardia, shortness of breath. This involves an extensive number of treatment options, and is a complaint that carries with it a high risk of complications and morbidity.  The differential diagnosis includes post prolonged COVID syndrome versus anemia versus arrhythmia versus PE versus congestive heart failure versus other  Co-morbidities that complicate the patient evaluation: Recent estrogen use transdermal patch at higher risk of thrombosis  I ordered and personally interpreted labs.  The pertinent results include: No emergent findings but D-dimer is noted to be elevated  I ordered imaging studies including x-ray of the chest, ct pe I independently visualized and interpreted imaging which showed atypical infectious inflammatory pattern with tree-in-bud opacities bilaterally I agree with the radiologist interpretation  The patient was maintained on a cardiac monitor.  I personally viewed and interpreted the cardiac monitored which showed an underlying rhythm of: Sinus tachycardia  Per my interpretation the patient's ECG shows sinus tachycardia with nonspecific repolarization abnormalities  I ordered medication including IV Rocephin and doxycycline for potential infectious etiology of lung disease  I have reviewed the patients home  medicines and have made adjustments as needed  After the interventions noted above, I reevaluated the patient and found that they have: improved    Dispostion:  After consideration of the diagnostic results and the patients response to treatment, I feel that the patent would benefit from close outpatient follow-up         Final Clinical Impression(s) / ED Diagnoses Final diagnoses:  Lung infection  Tachycardia    Rx / DC Orders ED Discharge Orders          Ordered    doxycycline (VIBRAMYCIN) 100 MG capsule  2 times daily        08/15/23 1141    fluconazole (DIFLUCAN) 200 MG tablet  Daily PRN  08/15/23 1141              Arvilla Birmingham, MD 08/15/23 [phone]

## 2023-08-15 NOTE — Telephone Encounter (Signed)
 Copied from CRM [id number]. Topic: Clinical - Red Word Triage >> Aug 15, 2023  8:07 AM Carlatta H wrote: [facility] that prompted transfer to Nurse Triage: Patients mom called//patient has be experiencing shortness of breath and heart racing//   Chief Complaint: Tachycardia/Palpitations Symptoms: palpitations,shortness of breath Frequency: off and on for a couple of weeks--but worse in the past week. Pertinent Negatives: Patient denies vomiting, diarrhea, fever, chest pain, abdominal pain Disposition: [x] ED /[] Urgent Care (no appt availability in office) / [] Appointment(In office/virtual)/ []  [facility]/ [] Home Care/ [] Refused Recommended Disposition /[] [facility]/ []  Follow-up with PCP Additional Notes: Patient called and advised that her heart would start racing off and on for the past couple of weeks but it has gotten worse in the past week. Patient had this same issue assessed in February and advised to keep an eye on her heart rate. Patient denies any chest pain, abdominal pain, vomiting, diarrhea, or fever at this time. Patient states that her heart rate is 122 during triage according to her Apple Watch.  She also endorses some shortness of breath at this time.  She states that her shortness of breath has been getting worse and she has been having these episodes more often in the past week and they have been lasting a lot longer.  Patient states she was woken up last night with an episode that lasted over an hour. She states that right now she can feel her heart beating fast, she is short of breath, and she has to sit down and rest if she tries to exert herself. Patient is advised that right now the recommendation is that she be seen at the Emergency Room for further evaluation. Patient verbalized understanding.  Reason for Disposition  New or worsened shortness of breath with activity (dyspnea on exertion)  Answer Assessment - Initial Assessment Questions 1.  DESCRIPTION: "Please describe your heart rate or heartbeat that you are having" (e.g., fast/slow, regular/irregular, skipped or extra beats, "palpitations")     palpitations 2. ONSET: "When did it start?" (Minutes, hours or days)      Couple of weeks ago but worse in the past week off and on 3. DURATION: "How long does it last" (e.g., seconds, minutes, hours)     Last night was the first time it lasted longer than an hour----two days ago episodes became longer 4. PATTERN "Does it come and go, or has it been constant since it started?"  "Does it get worse with exertion?"   "Are you feeling it now?"     Comes and goes 5. TAP: "Using your hand, can you tap out what you are feeling on a chair or table in front of you, so that I can hear?" (Note: not all patients can do this)       ---- 6. HEART RATE: "Can you tell me your heart rate?" "How many beats in 15 seconds?"  (Note: not all patients can do this)       122 heart rate during this phone call 7. RECURRENT SYMPTOM: "Have you ever had this before?" If Yes, ask: "When was the last time?" and "What happened that time?"      Feb--PCP did EKG and wanted to keep an eye on this 8. CAUSE: "What do you think is causing the palpitations?"     unknown 9. CARDIAC HISTORY: "Do you have any history of heart disease?" (e.g., heart attack, angina, bypass surgery, angioplasty, arrhythmia)      No 10. OTHER  SYMPTOMS: "Do you have any other symptoms?" (e.g., dizziness, chest pain, sweating, difficulty breathing)       Shortness of breath with episodes of tachycardia, nausea 11. PREGNANCY: "Is there any chance you are pregnant?" "When was your last menstrual period?"       No---April 1st last menstrual cycle  Protocols used: Heart Rate and Heartbeat Questions-A-AH

## 2023-08-15 NOTE — ED Notes (Signed)
 D/c paperwork reviewed with pt, including prescriptions and follow up care.  All questions and/or concerns addressed at time of d/c.  No further needs expressed. . Pt verbalized understanding, Ambulatory with family to ED exit, NAD.

## 2023-08-15 NOTE — ED Triage Notes (Signed)
 Arrives POV with complaints of elevated heart rate (high of the 140's) x4 days with shortness of breath. No previous history of this happening before.

## 2023-08-17 ENCOUNTER — Ambulatory Visit (INDEPENDENT_AMBULATORY_CARE_PROVIDER_SITE_OTHER): Admitting: Medical

## 2023-08-17 ENCOUNTER — Telehealth: Admitting: Medical

## 2023-08-17 VITALS — BP 122/80 | HR 88 | Wt 191.6 lb

## 2023-08-17 DIAGNOSIS — R0602 Shortness of breath: Secondary | ICD-10-CM

## 2023-08-17 DIAGNOSIS — R918 Other nonspecific abnormal finding of lung field: Secondary | ICD-10-CM

## 2023-08-17 DIAGNOSIS — R9389 Abnormal findings on diagnostic imaging of other specified body structures: Secondary | ICD-10-CM | POA: Insufficient documentation

## 2023-08-17 DIAGNOSIS — F419 Anxiety disorder, unspecified: Secondary | ICD-10-CM | POA: Diagnosis not present

## 2023-08-17 DIAGNOSIS — R000 Tachycardia, unspecified: Secondary | ICD-10-CM | POA: Insufficient documentation

## 2023-08-17 DIAGNOSIS — E559 Vitamin D deficiency, unspecified: Secondary | ICD-10-CM

## 2023-08-17 MED ORDER — ATENOLOL 25 MG PO TABS: 12.5000 mg | ORAL_TABLET | Freq: Every day | ORAL | 2 refills | Status: DC

## 2023-08-17 NOTE — Progress Notes (Signed)
 Subjective:  Melanie Mclaughlin is a 33 y.o. female who presents for Chief Complaint  Patient presents with   Hospitalization Follow-up    Hospital follow-up,, went to ER 2 days ago. Heart palpitations have decreased.      Here for emergency department follow-up.  She was seen in the emergency department in late February and in late April last week.  She had an illness in February and she went to emergency department.  Last week she went in for shortness of breath and tachycardia.  She has been using her heart monitor on her watch and her resting pulse rates to tend to be close to 100 or higher  She apparently had abnormal findings on chest imaging last week and was put on doxycycline  antibiotic which she is using.  She does not feel particularly sick.  The shortness of breath and tachycardia symptoms have gotten better since few days ago.  She is not a smoker.  She lives with her parents.    As far as she knows she does not snore loudly and there has been no concern for witnessed apnea.  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  Vitamin D  deficiency-she is taking vit d weekly since her physical visit back in February 2025   No other aggravating or relieving factors.    No other c/o.  Past Medical History:  Diagnosis Date   Allergy    RHINITIS   Ureterolithiasis    Current Outpatient Medications on File Prior to Visit  Medication Sig Dispense Refill   doxycycline  (VIBRAMYCIN ) 100 MG capsule Take 1 capsule (100 mg total) by mouth 2 (two) times daily for 7 days. 14 capsule 0   fluconazole  (DIFLUCAN ) 200 MG tablet Take 1 tablet (200 mg total) by mouth daily as needed for up to 1 dose. 1 tablet 0   Vitamin D , Ergocalciferol , (DRISDOL ) 1.25 MG (50000 UNIT) CAPS capsule Take 1 capsule (50,000 Units total) by mouth every 7 (seven) days. 12 capsule 1   No current facility-administered  medications on file prior to visit.     The following portions of the patient's history were reviewed and updated as appropriate: allergies, current medications, past family history, past medical history, past social history, past surgical history and problem list.  ROS Otherwise as in subjective above    Objective: BP 122/80   Pulse 88   Wt 191 lb 9.6 oz (86.9 kg)   LMP 07/18/2023   BMI 30.46 kg/m   General appearance: alert, no distress, well developed, well nourished HEENT: normocephalic, sclerae anicteric, conjunctiva pink and moist, TMs pearly, nares patent, no discharge or erythema, pharynx normal Oral cavity: MMM, no lesions Neck: supple, no lymphadenopathy, no thyromegaly, no masses Heart: RRR, normal S1, S2, no murmurs Lungs: CTA bilaterally, no wheezes, rhonchi, or rales Pulses: 2+ radial pulses, 2+ pedal pulses, normal cap refill Ext: no edema   CT chest 08/15/23: IMPRESSION: 1. No CT evidence for acute pulmonary embolus. 2. Patchy areas of clustered micro nodularity in the lung apices, right greater than left, and in the posterior lower lobes bilaterally showing tree-in-bud configuration in some areas. Imaging features are compatible with an infectious/inflammatory etiology with atypical etiology a distinct consideration.    Assessment: Encounter Diagnoses  Name Primary?   Tachycardia Yes   Abnormal chest CT    SOB (shortness of breath)    Anxiety  Vitamin D  deficiency    Abnormal lung field      Plan: We reviewed her recent emergency department visit notes, labs, chest imaging including her recent chest x-ray and chest CT from 08/15/2023  Abnormal chest CT, abnormal lung field-we discussed her recent findings as noted above.  Finish out the doxycycline  antibiotic.  Plan to repeat chest x-ray in 4 to 6 weeks.   if any abnormality remains at that point, consider referral to pulmonology  Tachycardia-looking back she has had requesting baseline pulse  rate of close to 100 in previous years as well.  This could be related to anxiety, obesity or other.  Begin trial of atenolol  1/2 tablet daily as below.  Discussed risk and benefits of medication.  Follow-up for 1 month  Anxiety-advise she talk with a counselor.  I gave resources for her to make an appoint with a counselor.  We discussed other ways to help deal with anxiety in general including hobbies, deep breathing exercises, taking a sabbath weekly, getting exercise regularly.  Vitamin D  deficiency-continue vitamin D  supplement   Melanie Mclaughlin was seen today for hospitalization follow-up.  Diagnoses and all orders for this visit:  Tachycardia -     DG Chest 2 View; Future  Abnormal chest CT -     DG Chest 2 View; Future  SOB (shortness of breath) -     DG Chest 2 View; Future  Anxiety  Vitamin D  deficiency  Abnormal lung field  Other orders -     atenolol  (TENORMIN ) 25 MG tablet; Take 0.5 tablets (12.5 mg total) by mouth daily.    Follow up: 5mo

## 2023-08-17 NOTE — Patient Instructions (Addendum)
 Please go to [facility] for your chest xray.   Their hours are 8am - 4:30 pm Monday - Friday.  Take your insurance card with you.  [facility] [phone]  [address] [place], [place] [address]     Counseling Services  [facility] Omelia Bibles [phone] [address] [place], [place] [address]   [facility] Alben Alma, counseling [address] [place], [place] [address] [phone]   Dr. Amon Bali, [place] [phone] [place], [place] [address]   [facility] [address] Zachery Hermes [place], [place] [address] [phone]    [facility] Crisis Line and Main phone number [phone]  [facility]  [phone]  [facility] [phone]  [facility] [phone]    Other Resources  If you are experiencing a mental health crisis or an emergency, please call 911 or go to the nearest emergency department.  [facility]   [phone] [facility]  [phone] [facility]   [phone]  Suicide Hotline 1-800-Suicide 438-508-3964)  National Suicide Prevention Lifeline 802-823-2736  (213)367-4809)  Domestic Violence, Rape/Crisis - [facility] [place] 676-195-0932  [employer] Violence Hotline 1-800-799-SAFE 2313760975)  To report Child or Elder Abuse, please call: [facility] Police Department  [phone] [facility] Department  [phone]  Teen Crisis line [phone] or [phone]

## 2023-09-18 ENCOUNTER — Encounter: Payer: Self-pay | Admitting: Medical

## 2023-09-18 ENCOUNTER — Ambulatory Visit (INDEPENDENT_AMBULATORY_CARE_PROVIDER_SITE_OTHER): Admitting: Medical

## 2023-09-18 VITALS — BP 132/68 | HR 97 | Wt 199.0 lb

## 2023-09-18 DIAGNOSIS — R000 Tachycardia, unspecified: Secondary | ICD-10-CM | POA: Diagnosis not present

## 2023-09-18 DIAGNOSIS — R002 Palpitations: Secondary | ICD-10-CM

## 2023-09-18 DIAGNOSIS — F419 Anxiety disorder, unspecified: Secondary | ICD-10-CM | POA: Diagnosis not present

## 2023-09-18 DIAGNOSIS — R9389 Abnormal findings on diagnostic imaging of other specified body structures: Secondary | ICD-10-CM | POA: Diagnosis not present

## 2023-09-18 DIAGNOSIS — Z30430 Encounter for insertion of intrauterine contraceptive device: Secondary | ICD-10-CM | POA: Diagnosis not present

## 2023-09-18 DIAGNOSIS — Z3202 Encounter for pregnancy test, result negative: Secondary | ICD-10-CM | POA: Diagnosis not present

## 2023-09-18 NOTE — Patient Instructions (Signed)
 Please go to [facility] for your chest xray.   Their hours are 8am - 4:30 pm Monday - Friday.  Take your insurance card with you.  [facility] [phone]   [address] [place], [place] [address]

## 2023-09-18 NOTE — Progress Notes (Signed)
 Subjective:  Melanie Mclaughlin is a 33 y.o. female who presents for Chief Complaint  Patient presents with   other    1 mth F/U heart palpitations- no problems since starting meds     Here for recheck on palpitations.  Last visit we initiated atenolol  25 mg, 1/2 tablet daily.  She is doing this and does feel an improvement.  Since last visit she has only had a few palpitations and her heart rate seems to be improved.  She does not feel a lot of stressors currently.  She works Psychologist, sport and exercise work.  She has not spoken with a counselor yet regarding anxiety.  She is looking into cost and coinsurance but has looked into [facility].  Last visit we discussed an emergency department visit she had in April and she had an abnormal CT finding.  She has not went for the repeat chest x-ray yet that was recommended.  No other new issues.  Non-smoker.  No other aggravating or relieving factors.    No other c/o.  Past Medical History:  Diagnosis Date   Allergy    RHINITIS   Ureterolithiasis    Current Outpatient Medications on File Prior to Visit  Medication Sig Dispense Refill   atenolol  (TENORMIN ) 25 MG tablet Take 0.5 tablets (12.5 mg total) by mouth daily. 30 tablet 2   Vitamin D , Ergocalciferol , (DRISDOL ) 1.25 MG (50000 UNIT) CAPS capsule Take 1 capsule (50,000 Units total) by mouth every 7 (seven) days. 12 capsule 1   No current facility-administered medications on file prior to visit.     The following portions of the patient's history were reviewed and updated as appropriate: allergies, current medications, past family history, past medical history, past social history, past surgical history and problem list.  ROS Otherwise as in subjective above    Objective: BP 132/68   Pulse 97   Wt 199 lb (90.3 kg)   SpO2 98%   BMI 31.64 kg/m   BP Readings from Last 3 Encounters:  09/18/23 132/68  08/17/23 122/80  08/15/23 118/82    General appearance: alert, no distress, well  developed, well nourished Heart: RRR, normal S1, S2, no murmurs Lungs: CTA bilaterally, no wheezes, rhonchi, or rales Pulses: 2+ radial pulses, 2+ pedal pulses, normal cap refill Ext: no edema   CT chest 08/15/23: IMPRESSION: 1. No CT evidence for acute pulmonary embolus. 2. Patchy areas of clustered micro nodularity in the lung apices, right greater than left, and in the posterior lower lobes bilaterally showing tree-in-bud configuration in some areas. Imaging features are compatible with an infectious/inflammatory etiology with atypical etiology a distinct consideration.    Assessment: Encounter Diagnoses  Name Primary?   Palpitation Yes   Anxiety    Tachycardia    Abnormal chest CT       Plan: We reviewed her recent emergency department visit notes, labs, chest imaging including her recent chest x-ray and chest CT from 08/15/2023  Abnormal chest CT, abnormal lung field-go for updated chest xray soon per recommendations from CT 07/2023  Tachycardia-improving on Atenolol  25mg , 1/2 tablet daily but can increase to 1 whole tablet daily.   Anxiety-advise she talk with a counselor.  I gave resources for her to make an appoint with a counselor last visit.   Melanie Mclaughlin was seen today for other.  Diagnoses and all orders for this visit:  Palpitation  Anxiety  Tachycardia  Abnormal chest CT    Follow up: with chest xray

## 2023-09-24 DIAGNOSIS — H52229 Regular astigmatism, unspecified eye: Secondary | ICD-10-CM | POA: Diagnosis not present

## 2023-09-26 ENCOUNTER — Other Ambulatory Visit: Payer: Self-pay | Admitting: Medical

## 2023-09-26 MED ORDER — ATENOLOL 25 MG PO TABS: 25.0000 mg | ORAL_TABLET | Freq: Every day | ORAL | 2 refills | Status: DC

## 2023-10-23 ENCOUNTER — Other Ambulatory Visit: Payer: Self-pay | Admitting: Internal Medicine

## 2023-10-23 ENCOUNTER — Ambulatory Visit
Admission: RE | Admit: 2023-10-23 | Discharge: 2023-10-23 | Disposition: A | Discharge status: Home / Self Care | Source: Ambulatory Visit | Attending: Medical | Admitting: Medical

## 2023-10-23 DIAGNOSIS — R0602 Shortness of breath: Secondary | ICD-10-CM | POA: Diagnosis not present

## 2023-10-23 NOTE — Progress Notes (Signed)
 che

## 2023-10-24 ENCOUNTER — Ambulatory Visit: Payer: Self-pay | Admitting: Medical

## 2023-10-24 NOTE — Progress Notes (Signed)
 Results sent through MyChart

## 2023-10-27 ENCOUNTER — Other Ambulatory Visit: Payer: Self-pay | Admitting: Medical

## 2023-10-27 MED ORDER — ATENOLOL 50 MG PO TABS: 50.0000 mg | ORAL_TABLET | Freq: Every day | ORAL | 1 refills | Status: DC

## 2023-12-02 ENCOUNTER — Other Ambulatory Visit: Payer: Self-pay | Admitting: Medical

## 2023-12-11 ENCOUNTER — Ambulatory Visit (INDEPENDENT_AMBULATORY_CARE_PROVIDER_SITE_OTHER): Admitting: Medical

## 2023-12-11 VITALS — BP 122/80 | HR 80 | Wt 205.2 lb

## 2023-12-11 DIAGNOSIS — R0789 Other chest pain: Secondary | ICD-10-CM | POA: Diagnosis not present

## 2023-12-11 DIAGNOSIS — R9389 Abnormal findings on diagnostic imaging of other specified body structures: Secondary | ICD-10-CM

## 2023-12-11 DIAGNOSIS — B9689 Other specified bacterial agents as the cause of diseases classified elsewhere: Secondary | ICD-10-CM | POA: Diagnosis not present

## 2023-12-11 DIAGNOSIS — R002 Palpitations: Secondary | ICD-10-CM | POA: Diagnosis not present

## 2023-12-11 DIAGNOSIS — N898 Other specified noninflammatory disorders of vagina: Secondary | ICD-10-CM | POA: Diagnosis not present

## 2023-12-11 DIAGNOSIS — Z30431 Encounter for routine checking of intrauterine contraceptive device: Secondary | ICD-10-CM | POA: Diagnosis not present

## 2023-12-11 DIAGNOSIS — E559 Vitamin D deficiency, unspecified: Secondary | ICD-10-CM | POA: Diagnosis not present

## 2023-12-11 DIAGNOSIS — N760 Acute vaginitis: Secondary | ICD-10-CM | POA: Diagnosis not present

## 2023-12-11 NOTE — Progress Notes (Signed)
 Subjective:  Melanie Mclaughlin is a 33 y.o. female who presents for Chief Complaint  Patient presents with   Follow-up    Follow-up on chest tightness that she has been having.    Here for recheck on palpitations.  Last visit June for the same.  She is currently on atenolol  50 mg daily.  This does help with palpitation but she still gets random discomfort in her left chest.  Not associated by eating certain foods or certain activities.  She can get these discomforts randomly.  They happen fairly frequently though.  No shortness of breath, no syncope.  Not currently seeing a counselor.  We discussed stress as a possible trigger for palpitations last visit but she does not feel that stress.  She does wear a Apple smart watch and her pulse today ranged from 42-144.  She did walk fast to go to a gynecology appointment.  she has never seen an reading as low as 42 today.  She is compliant with her vitamin D  supplement.  Non-smoker.  No other aggravating or relieving factors.    No other c/o.  Past Medical History:  Diagnosis Date   Allergy    RHINITIS   Ureterolithiasis    Current Outpatient Medications on File Prior to Visit  Medication Sig Dispense Refill   atenolol  (TENORMIN ) 50 MG tablet Take 1 tablet (50 mg total) by mouth daily. 30 tablet 1   Vitamin D , Ergocalciferol , (DRISDOL ) 1.25 MG (50000 UNIT) CAPS capsule TAKE 1 CAPSULE (50,000 UNITS TOTAL) BY MOUTH EVERY 7 (SEVEN) DAYS 4 capsule 5   No current facility-administered medications on file prior to visit.     The following portions of the patient's history were reviewed and updated as appropriate: allergies, current medications, past family history, past medical history, past social history, past surgical history and problem list.  ROS Otherwise as in subjective above    Objective: BP 122/80   Pulse 80   Wt 205 lb 3.2 oz (93.1 kg)   SpO2 100%   BMI 32.62 kg/m   BP Readings from Last 3 Encounters:  12/11/23  122/80  09/18/23 132/68  08/17/23 122/80    General appearance: alert, no distress, well developed, well nourished Heart: RRR, normal S1, S2, no murmurs Lungs: CTA bilaterally, no wheezes, rhonchi, or rales Pulses: 2+ radial pulses, 2+ pedal pulses, normal cap refill Ext: no edema No chest wall tenderness.   CT chest 08/15/23: IMPRESSION: 1. No CT evidence for acute pulmonary embolus. 2. Patchy areas of clustered micro nodularity in the lung apices, right greater than left, and in the posterior lower lobes bilaterally showing tree-in-bud configuration in some areas. Imaging features are compatible with an infectious/inflammatory etiology with atypical etiology a distinct consideration.    Assessment: Encounter Diagnoses  Name Primary?   Chest discomfort Yes   Palpitation    Abnormal chest CT    Vitamin D  deficiency       Plan: Chest discomfort  Unclear etiology Labs as below Consider cardiology consult Consider sleep study Continue Atenolol   Palpitations - continue Atenolol  50mg  daily, limit caffeine, alcohol  Vitamin D  deficiency - updated labs today, continue supplement  Abnormal findings on chest CT 07/2023 Labs today as below   Bona was seen today for follow-up.  Diagnoses and all orders for this visit:  Chest discomfort -     Basic metabolic panel with GFR -     VITAMIN D  25 Hydroxy (Vit-D Deficiency, Fractures) -     Sedimentation rate  Palpitation -     Basic metabolic panel with GFR -     Magnesium  Abnormal chest CT -     Sedimentation rate  Vitamin D  deficiency -     VITAMIN D  25 Hydroxy (Vit-D Deficiency, Fractures)    Follow up: with chest xray

## 2023-12-11 NOTE — Patient Instructions (Signed)
 Chest discomfort  Unclear etiology Labs as below Consider cardiology consult Consider sleep study Continue Atenolol   Palpitations - continue Atenolol  50mg  daily, limit caffeine, alcohol  Vitamin D  deficiency - updated labs today, continue supplement  Abnormal findings on chest CT 07/2023 Labs today as below

## 2023-12-12 ENCOUNTER — Ambulatory Visit: Payer: Self-pay | Admitting: Medical

## 2023-12-12 ENCOUNTER — Other Ambulatory Visit: Payer: Self-pay | Admitting: Medical

## 2023-12-12 DIAGNOSIS — R0789 Other chest pain: Secondary | ICD-10-CM

## 2023-12-12 DIAGNOSIS — R002 Palpitations: Secondary | ICD-10-CM

## 2023-12-12 LAB — BASIC METABOLIC PANEL WITH GFR
BUN/Creatinine Ratio: 9 (ref 9–23)
BUN: 8 mg/dL (ref 6–20)
CO2: 23 mmol/L (ref 20–29)
Calcium: 9.5 mg/dL (ref 8.7–10.2)
Chloride: 103 mmol/L (ref 96–106)
Creatinine, Ser: 0.86 mg/dL (ref 0.57–1.00)
Glucose: 87 mg/dL (ref 70–99)
Potassium: 4.9 mmol/L (ref 3.5–5.2)
Sodium: 138 mmol/L (ref 134–144)
eGFR: 92 mL/min/1.73 (ref >59)

## 2023-12-12 LAB — VITAMIN D 25 HYDROXY (VIT D DEFICIENCY, FRACTURES): Vit D, 25-Hydroxy: 37.5 ng/mL (ref 30.0–100.0)

## 2023-12-12 LAB — SEDIMENTATION RATE: Sed Rate: 25 mm/h (ref 0–32)

## 2023-12-12 LAB — MAGNESIUM: Magnesium: 2.1 mg/dL (ref 1.6–2.3)

## 2023-12-12 MED ORDER — ATENOLOL 50 MG PO TABS: 50.0000 mg | ORAL_TABLET | Freq: Every day | ORAL | 2 refills | Status: DC

## 2023-12-12 NOTE — Telephone Encounter (Signed)
Placed referral to Cardiology.

## 2023-12-12 NOTE — Progress Notes (Signed)
 Results to MyChart

## 2024-01-02 ENCOUNTER — Other Ambulatory Visit: Payer: Self-pay | Admitting: Medical

## 2024-01-02 NOTE — Telephone Encounter (Signed)
 Dose was increased.

## 2024-04-11 ENCOUNTER — Other Ambulatory Visit: Payer: Self-pay | Admitting: Medical

## 2024-06-17 ENCOUNTER — Encounter: Payer: 59 | Admitting: Medical
